# Patient Record
Sex: Female | Born: 1995 | Race: White | Hispanic: No | Marital: Single | Smoking: Never smoker
Health system: Southern US, Community
[De-identification: ages and names within clinical notes are randomized; demographics above are authoritative.]

## PROBLEM LIST (undated history)

## (undated) DIAGNOSIS — F419 Anxiety disorder, unspecified: Secondary | ICD-10-CM

## (undated) HISTORY — PX: WISDOM TOOTH EXTRACTION: SHX21

---
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
## 2016-06-01 ENCOUNTER — Ambulatory Visit: Payer: Self-pay | Admitting: Obstetrics & Gynecology

## 2016-06-01 ENCOUNTER — Telehealth: Payer: Self-pay | Admitting: Obstetrics & Gynecology

## 2016-06-01 NOTE — Telephone Encounter (Signed)
OK, see if Friday is OK (next time I am here).  OK to overbook after 3.

## 2016-06-01 NOTE — Telephone Encounter (Signed)
-----   Message from Nadara Mustard, MD sent at 06/01/2016  7:37 AM EDT ----- Regarding: appt this pm Please work in patient today after 3; let me know time

## 2016-06-01 NOTE — Telephone Encounter (Signed)
Called and spoke with Pt about working in today at 3:50. Per pt stated she wouldn't be able to come in.

## 2016-06-02 NOTE — Telephone Encounter (Signed)
Pt is schedule 06/05/16 with [facility] at 4:30

## 2016-06-05 ENCOUNTER — Encounter: Payer: Self-pay | Admitting: Obstetrics & Gynecology

## 2016-06-05 ENCOUNTER — Ambulatory Visit (INDEPENDENT_AMBULATORY_CARE_PROVIDER_SITE_OTHER): Payer: Commercial Managed Care - PPO | Admitting: Obstetrics & Gynecology

## 2016-06-05 VITALS — BP 116/70 | Ht 63.0 in | Wt 152.0 lb

## 2016-06-05 DIAGNOSIS — N930 Postcoital and contact bleeding: Secondary | ICD-10-CM | POA: Diagnosis not present

## 2016-06-05 NOTE — Progress Notes (Signed)
  HPI:      [name] is a 21 y.o. G0P0000 who LMP was Patient's last menstrual period was 05/08/2016., presents today for a problem visit.  She complains of: post coital bleeding.Vulvar symptoms include none.STI Risk: prior hx of treated chlamydia.  Other associated symptoms: none.Menstrual pattern: She had been bleeding regularly. Contraception: NuvaRing vaginal inserts  PMHx: She  has no past medical history on file. Also,  has a past surgical history that includes Wisdom tooth extraction., family history is not on file.,    She has a current medication list which includes the following prescription(s): etonogestrel-ethinyl estradiol and paroxetine. Also, has No Known Allergies.  Review of Systems  Constitutional: Negative for chills, fever and malaise/fatigue.  HENT: Negative for congestion, sinus pain and sore throat.   Eyes: Negative for blurred vision and pain.  Respiratory: Negative for cough and wheezing.   Cardiovascular: Negative for chest pain and leg swelling.  Gastrointestinal: Negative for abdominal pain, constipation, diarrhea, heartburn, nausea and vomiting.  Genitourinary: Negative for dysuria, frequency, hematuria and urgency.  Musculoskeletal: Negative for back pain, joint pain, myalgias and neck pain.  Skin: Negative for itching and rash.  Neurological: Negative for dizziness, tremors and weakness.  Endo/Heme/Allergies: Does not bruise/bleed easily.  Psychiatric/Behavioral: Negative for depression. The patient is not nervous/anxious and does not have insomnia.     Objective: BP 116/70   Ht 5\' 3"  (1.6 m)   Wt 152 lb (68.9 kg)   LMP 05/08/2016   BMI 26.93 kg/m  Physical Exam  Constitutional: She is oriented to person, place, and time. She appears well-developed and well-nourished. No distress.  Genitourinary: Vagina normal and uterus normal. Pelvic exam was performed with patient supine. There is no rash, tenderness or lesion on the right labia. There  is no rash, tenderness or lesion on the left labia. No erythema or bleeding in the vagina. Right adnexum does not display mass and does not display tenderness. Left adnexum does not display mass and does not display tenderness. Cervix does not exhibit motion tenderness, discharge, polyp or nabothian cyst.   Uterus is mobile and midaxial. Uterus is not enlarged or exhibiting a mass.  Abdominal: Soft. She exhibits no distension. There is no tenderness.  Musculoskeletal: Normal range of motion.  Neurological: She is alert and oriented to person, place, and time. No cranial nerve deficit.  Skin: Skin is warm and dry.  Psychiatric: She has a normal mood and affect.    ASSESSMENT/PLAN:    Problem List Items Addressed This Visit    None    Visit Diagnoses    PCB (post coital bleeding)    -  Primary   Relevant Orders   GC/Chlamydia Probe Amp      Annamarie MajorPaul Angeleen Horney, MD, Merlinda FrederickFACOG [facility], [facility] 06/05/2016  5:12 PM

## 2016-06-07 ENCOUNTER — Encounter: Payer: Self-pay | Admitting: Obstetrics & Gynecology

## 2016-06-10 LAB — GC/CHLAMYDIA PROBE AMP
Chlamydia trachomatis, NAA: NEGATIVE
Neisseria gonorrhoeae by PCR: NEGATIVE

## 2016-08-26 ENCOUNTER — Ambulatory Visit: Payer: Self-pay | Admitting: Obstetrics & Gynecology

## 2016-08-27 ENCOUNTER — Encounter: Payer: Self-pay | Admitting: Obstetrics & Gynecology

## 2016-08-27 ENCOUNTER — Ambulatory Visit (INDEPENDENT_AMBULATORY_CARE_PROVIDER_SITE_OTHER): Payer: Commercial Managed Care - PPO | Admitting: Obstetrics & Gynecology

## 2016-08-27 ENCOUNTER — Telehealth: Payer: Self-pay | Admitting: Obstetrics & Gynecology

## 2016-08-27 VITALS — BP 124/76 | Ht 63.0 in | Wt 143.0 lb

## 2016-08-27 DIAGNOSIS — Z202 Contact with and (suspected) exposure to infections with a predominantly sexual mode of transmission: Secondary | ICD-10-CM

## 2016-08-27 NOTE — Telephone Encounter (Signed)
Pt is schedule for today [facility]27/16 [facility]

## 2016-08-27 NOTE — Progress Notes (Signed)
  Gynecology STD Evaluation   Chief Complaint:  Chief Complaint  Patient presents with  . Follow-up    History of Present Illness: Patient is a 21 y.o. G0P0000 presents for STD testing.  The patient has not noted intermenstrual spotting,  has not experienced postcoital bleeding, and does not report increased vaginal discharge.  There is a history of prior sexually transmitted infection(s).     The patient is sexually active and reports multiple lifetime sexual partners . She currently uses Oral contraceptives: Present: yes for contraception. sometimes, the patient also relies on condoms to prevent the spread of sexually transmitted infections.  She patient has not been previously transfused or tattooed.    PMHx: She  has no past medical history on file. Also,  has a past surgical history that includes Wisdom tooth extraction., family history is not on file.,  has an unknown smoking status. She has never used smokeless tobacco.  She has a current medication list which includes the following prescription(s): etonogestrel-ethinyl estradiol and paroxetine. Also, has No Known Allergies.  Review of Systems  All other systems reviewed and are negative.   Objective: BP 124/76   Ht 5\' 3"  (1.6 m)   Wt 143 lb (64.9 kg)   LMP 07/28/2016   BMI 25.33 kg/m  Physical Exam  Constitutional: She is oriented to person, place, and time. She appears well-developed and well-nourished. No distress.  Genitourinary: Vagina normal and uterus normal. Pelvic exam was performed with patient supine. There is no rash, tenderness or lesion on the right labia. There is no rash, tenderness or lesion on the left labia. No erythema or bleeding in the vagina. Right adnexum does not display mass and does not display tenderness. Left adnexum does not display mass and does not display tenderness. Cervix does not exhibit motion tenderness, discharge, polyp or nabothian cyst.   Uterus is mobile and midaxial. Uterus is not  enlarged or exhibiting a mass.  Abdominal: Soft. She exhibits no distension. There is no tenderness.  Musculoskeletal: Normal range of motion.  Neurological: She is alert and oriented to person, place, and time. No cranial nerve deficit.  Skin: Skin is warm and dry.  Psychiatric: She has a normal mood and affect.    Female chaperone present for pelvic and breast  portions of the physical exam  Assessment: 21 y.o. G0P0000 No problem-specific Assessment & Plan notes found for this encounter.   Plan: Problem List Items Addressed This Visit      Other   STD exposure - Primary   Relevant Orders   Chlamydia/Gonococcus/Trichomonas, NAA       1) Contraception - Education given regarding options for contraception, including barrier methods.   2) STI screening was offered done (declines blood testing, done in past)  3) STD prevention  Annamarie MajorPaul Offie Pickron, MD, Merlinda FrederickFACOG [facility], [facility] 08/27/2016  10:33 AM

## 2016-08-27 NOTE — Telephone Encounter (Signed)
-----   Message from Nadara Mustardobert P Harris, MD sent at 08/27/2016  7:42 AM EDT ----- Regarding: appt Plz work in 0940 today and let her know

## 2016-08-29 LAB — CHLAMYDIA/GONOCOCCUS/TRICHOMONAS, NAA
CHLAMYDIA BY NAA: NEGATIVE
Gonococcus by NAA: NEGATIVE
Trich vag by NAA: NEGATIVE

## 2017-05-25 ENCOUNTER — Ambulatory Visit (INDEPENDENT_AMBULATORY_CARE_PROVIDER_SITE_OTHER): Payer: Commercial Managed Care - PPO | Admitting: Obstetrics and Gynecology

## 2017-05-25 ENCOUNTER — Encounter: Payer: Self-pay | Admitting: Obstetrics and Gynecology

## 2017-05-25 VITALS — BP 106/54 | HR 93 | Ht 64.0 in | Wt 146.0 lb

## 2017-05-25 DIAGNOSIS — Z113 Encounter for screening for infections with a predominantly sexual mode of transmission: Secondary | ICD-10-CM | POA: Diagnosis not present

## 2017-05-25 DIAGNOSIS — N760 Acute vaginitis: Secondary | ICD-10-CM | POA: Diagnosis not present

## 2017-05-25 NOTE — Progress Notes (Signed)
Patient ID: Dana Ruiz, female   DOB: 1995/02/19, 22 y.o.   MRN: [id number]  Reason for Consult: vaginal irritation (STD check)   Referred by No ref. provider found  Subjective:     HPI:  Dana Albertabigail Leigh Fabel is a 22 y.o. female she presents today with complaints of vaginal irritation and pain that was experienced with recent sexual activity.   History reviewed. No pertinent past medical history. History reviewed. No pertinent family history. Past Surgical History:  Procedure Laterality Date  . WISDOM TOOTH EXTRACTION      Short Social History:  Social History   Tobacco Use  . Smoking status: Never Smoker  . Smokeless tobacco: Never Used  Substance Use Topics  . Alcohol use: Yes    Comment: Occa    No Known Allergies  Current Outpatient Medications  Medication Sig Dispense Refill  . etonogestrel-ethinyl estradiol (NUVARING) 0.12-0.015 MG/24HR vaginal ring Place 1 each vaginally every 28 (twenty-eight) days. Insert vaginally and leave in place for 3 consecutive weeks, then remove for 1 week.    Marland Kitchen. PARoxetine (PAXIL) 10 MG tablet Take 10 mg by mouth daily.     No current facility-administered medications for this visit.     Review of Systems  Constitutional: Negative for chills, fatigue, fever and unexpected weight change.  HENT: Negative for trouble swallowing.  Eyes: Negative for loss of vision.  Respiratory: Negative for cough, shortness of breath and wheezing.  Cardiovascular: Negative for chest pain, leg swelling, palpitations and syncope.  GI: Negative for abdominal pain, blood in stool, diarrhea, nausea and vomiting.  GU: Negative for difficulty urinating, dysuria, frequency and hematuria.  Musculoskeletal: Negative for back pain, leg pain and joint pain.  Skin: Negative for rash.  Neurological: Negative for dizziness, headaches, light-headedness, numbness and seizures.  Psychiatric: Negative for behavioral problem, confusion, depressed mood and sleep  disturbance.        Objective:  Objective   Vitals:   05/25/17 1450  BP: (!) 106/54  Pulse: 93  Weight: 146 lb (66.2 kg)  Height: 5\' 4"  (1.626 m)   Body mass index is 25.06 kg/m.  Physical Exam  Constitutional: She is oriented to person, place, and time. She appears well-developed and well-nourished.  HENT:  Head: Normocephalic and atraumatic.  Eyes: EOM are normal.  Cardiovascular: Normal rate, regular rhythm and normal heart sounds.  Pulmonary/Chest: Effort normal and breath sounds normal.  Genitourinary: Vagina normal and uterus normal. There is no rash, tenderness or lesion on the right labia. There is no rash, tenderness or lesion on the left labia. Uterus is not deviated, not enlarged, not fixed and not tender. Cervix exhibits friability. Cervix exhibits no motion tenderness and no discharge. Right adnexum displays no mass, no tenderness and no fullness. Left adnexum displays no mass, no tenderness and no fullness. No erythema or bleeding in the vagina. No foreign body in the vagina. No vaginal discharge found.  Genitourinary Comments: Scant vaginal discharge. Two flat/firm lesions on left thigh near genitals concerning for genital warts, but patient reports that these have been present since before she was sexually active.  Neurological: She is alert and oriented to person, place, and time.  Skin: Skin is warm and dry.  Psychiatric: She has a normal mood and affect. Her behavior is normal. Judgment and thought content normal.  Nursing note and vitals reviewed.  Microscopic wet-mount exam shows negative for pathogens, normal epithelial cells.      Assessment/Plan:     22 year old  with vaginitis, no obvious infection at this time. Nuswab sent for STD testing, will treat based on results.   Adelene Idler MD [facility], [facility] 05/25/17 3:18 PM

## 2017-05-28 LAB — NUSWAB VAGINITIS PLUS (VG+)
CHLAMYDIA TRACHOMATIS, NAA: NEGATIVE
Candida albicans, NAA: NEGATIVE
Candida glabrata, NAA: NEGATIVE
NEISSERIA GONORRHOEAE, NAA: NEGATIVE
TRICH VAG BY NAA: NEGATIVE

## 2017-05-30 NOTE — Progress Notes (Signed)
Negative, released to mychart

## 2017-06-17 ENCOUNTER — Encounter: Payer: Self-pay | Admitting: Obstetrics & Gynecology

## 2017-06-17 ENCOUNTER — Ambulatory Visit (INDEPENDENT_AMBULATORY_CARE_PROVIDER_SITE_OTHER): Payer: 59 | Admitting: Obstetrics & Gynecology

## 2017-06-17 VITALS — BP 110/60 | Ht 63.0 in | Wt 148.0 lb

## 2017-06-17 DIAGNOSIS — R300 Dysuria: Secondary | ICD-10-CM | POA: Diagnosis not present

## 2017-06-17 DIAGNOSIS — N3000 Acute cystitis without hematuria: Secondary | ICD-10-CM

## 2017-06-17 LAB — POCT URINALYSIS DIPSTICK
Bilirubin, UA: NEGATIVE
Blood, UA: NEGATIVE
Glucose, UA: NEGATIVE
Ketones, UA: NEGATIVE
Nitrite, UA: NEGATIVE
Protein, UA: NEGATIVE
Spec Grav, UA: 1.015 (ref 1.010–1.025)
Urobilinogen, UA: 1 E.U./dL
pH, UA: 7 (ref 5.0–8.0)

## 2017-06-17 MED ORDER — FLAVOXATE HCL 100 MG PO TABS: 100.0000 mg | ORAL_TABLET | Freq: Three times a day (TID) | ORAL | 1 refills | Status: DC | PRN

## 2017-06-17 MED ORDER — SULFAMETHOXAZOLE-TRIMETHOPRIM 800-160 MG PO TABS: 1.0000 | ORAL_TABLET | Freq: Two times a day (BID) | ORAL | 0 refills | Status: DC

## 2017-06-17 NOTE — Progress Notes (Signed)
HPI:      [name] is a 22 y.o. G0P0000 who LMP was Patient's last menstrual period was 05/25/2017., presents today for a problem visit.    Urinary Tract Infection: Patient complains of burning with urination . She has had symptoms for 1 day. Patient also complains of concern over condome breaking last night and concern for retained latex/condom vaginally.  Pt uses ring for [facility] and it is in place.. Patient denies back pain, fever and vaginal discharge. Patient does not have a history of recurrent UTI.  Patient does not have a history of pyelonephritis.   PMHx: She  has no past medical history on file. Also,  has a past surgical history that includes Wisdom tooth extraction., family history is not on file.,  reports that she has never smoked. She has never used smokeless tobacco. She reports that she drinks alcohol. She reports that she does not use drugs.  She has a current medication list which includes the following prescription(s): etonogestrel-ethinyl estradiol, flavoxate, paroxetine, and sulfamethoxazole-trimethoprim. Also, has No Known Allergies.  Review of Systems  Constitutional: Negative for chills, fever and malaise/fatigue.  HENT: Negative for congestion, sinus pain and sore throat.   Eyes: Negative for blurred vision and pain.  Respiratory: Negative for cough and wheezing.   Cardiovascular: Negative for chest pain and leg swelling.  Gastrointestinal: Negative for abdominal pain, constipation, diarrhea, heartburn, nausea and vomiting.  Genitourinary: Negative for dysuria, frequency, hematuria and urgency.  Musculoskeletal: Negative for back pain, joint pain, myalgias and neck pain.  Skin: Negative for itching and rash.  Neurological: Negative for dizziness, tremors and weakness.  Endo/Heme/Allergies: Does not bruise/bleed easily.  Psychiatric/Behavioral: Negative for depression. The patient is not nervous/anxious and does not have insomnia.     Objective: BP 110/60    Ht  (1.6 m)   Wt 148 lb (67.1 kg)   LMP 05/25/2017   BMI 26.22 kg/m  Physical Exam  Constitutional: She is oriented to person, place, and time. She appears well-developed and well-nourished. No distress.  Genitourinary: Vagina normal and uterus normal. Pelvic exam was performed with patient supine. There is no rash, tenderness or lesion on the right labia. There is no rash, tenderness or lesion on the left labia. No erythema or bleeding in the vagina. Right adnexum does not display mass and does not display tenderness. Left adnexum does not display mass and does not display tenderness. Cervix does not exhibit motion tenderness, discharge, polyp or nabothian cyst.   Uterus is mobile and midaxial. Uterus is not enlarged or exhibiting a mass.  Genitourinary Comments: No foreign body in vagina  HENT:  Head: Normocephalic and atraumatic.  Nose: Nose normal.  Mouth/Throat: Oropharynx is clear and moist.  Abdominal: Soft. She exhibits no distension. There is no tenderness.  Musculoskeletal: Normal range of motion.  Neurological: She is alert and oriented to person, place, and time. No cranial nerve deficit.  Skin: Skin is warm and dry.  Psychiatric: She has a normal mood and affect.   Results for orders placed or performed in visit on 06/17/17  POCT urinalysis dipstick  Result Value Ref Range   Color, UA     Clarity, UA     Glucose, UA neg    Bilirubin, UA neg    Ketones, UA neg    Spec Grav, UA 1.015 1.010 - 1.025   Blood, UA neg    pH, UA 7.0 5.0 - 8.0   Protein, UA neg    Urobilinogen,  UA 1.0 0.2 or 1.0 E.U./dL   Nitrite, UA neg    Leukocytes, UA Moderate (2+) (A) Negative   Appearance     Odor     ASSESSMENT/PLAN:   Acute cystitis  Dysuria    -  Primary   Relevant Orders   POCT urinalysis dipstick (Completed)    ABX for UTI Urispas for dysuria  Annamarie Major, MD, [facility], [facility] 06/17/2017  11:19 AM

## 2017-06-18 ENCOUNTER — Ambulatory Visit: Payer: Commercial Managed Care - PPO | Admitting: Obstetrics & Gynecology

## 2017-06-18 ENCOUNTER — Other Ambulatory Visit: Payer: Self-pay | Admitting: Obstetrics & Gynecology

## 2017-06-18 MED ORDER — CIPROFLOXACIN HCL 500 MG PO TABS: 500.0000 mg | ORAL_TABLET | Freq: Two times a day (BID) | ORAL | 0 refills | Status: AC

## 2017-07-20 ENCOUNTER — Other Ambulatory Visit: Payer: Self-pay | Admitting: Obstetrics & Gynecology

## 2017-07-20 ENCOUNTER — Encounter: Payer: Self-pay | Admitting: Obstetrics & Gynecology

## 2017-07-20 MED ORDER — ETONOGESTREL-ETHINYL ESTRADIOL 0.12-0.015 MG/24HR VA RING: 1.0000 | VAGINAL_RING | VAGINAL | 3 refills | Status: DC

## 2017-08-06 ENCOUNTER — Encounter: Payer: Self-pay | Admitting: Obstetrics & Gynecology

## 2017-08-23 ENCOUNTER — Encounter: Payer: Self-pay | Admitting: Obstetrics & Gynecology

## 2017-08-23 ENCOUNTER — Ambulatory Visit (INDEPENDENT_AMBULATORY_CARE_PROVIDER_SITE_OTHER): Payer: 59 | Admitting: Obstetrics & Gynecology

## 2017-08-23 ENCOUNTER — Other Ambulatory Visit (HOSPITAL_COMMUNITY)
Admission: RE | Admit: 2017-08-23 | Discharge: 2017-08-23 | Disposition: A | Discharge status: Home / Self Care | Payer: 59 | Source: Ambulatory Visit | Attending: Obstetrics & Gynecology | Admitting: Obstetrics & Gynecology

## 2017-08-23 VITALS — BP 110/80 | Ht 63.0 in | Wt 149.0 lb

## 2017-08-23 DIAGNOSIS — Z124 Encounter for screening for malignant neoplasm of cervix: Secondary | ICD-10-CM

## 2017-08-23 DIAGNOSIS — Z01419 Encounter for gynecological examination (general) (routine) without abnormal findings: Secondary | ICD-10-CM | POA: Diagnosis not present

## 2017-08-23 DIAGNOSIS — Z0000 Encounter for general adult medical examination without abnormal findings: Secondary | ICD-10-CM

## 2017-08-23 MED ORDER — ETONOGESTREL-ETHINYL ESTRADIOL 0.12-0.015 MG/24HR VA RING: 1.0000 | VAGINAL_RING | VAGINAL | 3 refills | Status: DC

## 2017-08-23 NOTE — Progress Notes (Signed)
HPI:      [name] is a 22 y.o. G0P0000 who LMP was Patient's last menstrual period was 08/19/2017., she presents today for her annual examination. The patient has no complaints today other than pain vaginally w sex at times, burning pain, no deep pain.  No recent discharge. The patient is sexually active. Her last pap: was normal. The patient does perform self breast exams.  There is no notable family history of breast or ovarian cancer in her family.  The patient has regular exercise: yes.  The patient denies current symptoms of depression.    GYN History: Contraception: NuvaRing vaginal inserts  PMHx: History reviewed. No pertinent past medical history. Past Surgical History:  Procedure Laterality Date  . WISDOM TOOTH EXTRACTION     History reviewed. No pertinent family history. Social History   Tobacco Use  . Smoking status: Never Smoker  . Smokeless tobacco: Never Used  Substance Use Topics  . Alcohol use: Yes    Comment: Occa  . Drug use: Never    Current Outpatient Medications:  .  etonogestrel-ethinyl estradiol (NUVARING) 0.12-0.015 MG/24HR vaginal ring, Place 1 each vaginally every 28 (twenty-eight) days. Insert vaginally and leave in place for 3 consecutive weeks, then remove for 1 week., Disp: 3 each, Rfl: 3 .  flavoxATE (URISPAS) 100 MG tablet, Take 1 tablet (100 mg total) by mouth 3 (three) times daily as needed for bladder spasms (bladder spasm)., Disp: 15 tablet, Rfl: 1 .  PARoxetine (PAXIL) 10 MG tablet, Take 10 mg by mouth daily., Disp: , Rfl:  Allergies: Patient has no known allergies.  Review of Systems  Constitutional: Negative for chills, fever and malaise/fatigue.  HENT: Negative for congestion, sinus pain and sore throat.   Eyes: Negative for blurred vision and pain.  Respiratory: Negative for cough and wheezing.   Cardiovascular: Negative for chest pain and leg swelling.  Gastrointestinal: Negative for abdominal pain, constipation,  diarrhea, heartburn, nausea and vomiting.  Genitourinary: Negative for dysuria, frequency, hematuria and urgency.  Musculoskeletal: Negative for back pain, joint pain, myalgias and neck pain.  Skin: Negative for itching and rash.  Neurological: Negative for dizziness, tremors and weakness.  Endo/Heme/Allergies: Does not bruise/bleed easily.  Psychiatric/Behavioral: Negative for depression. The patient is not nervous/anxious and does not have insomnia.     Objective: BP 110/80   Ht 5\' 3"  (1.6 m)   Wt 149 lb (67.6 kg)   LMP 08/19/2017   BMI 26.39 kg/m   Filed Weights   08/23/17 1007  Weight: 149 lb (67.6 kg)   Body mass index is 26.39 kg/m. Physical Exam  Constitutional: She is oriented to person, place, and time. She appears well-developed and well-nourished. No distress.  Genitourinary: Rectum normal, vagina normal and uterus normal. Pelvic exam was performed with patient supine. There is no rash or lesion on the right labia. There is no rash or lesion on the left labia. Vagina exhibits no lesion. No bleeding in the vagina. Right adnexum does not display mass and does not display tenderness. Left adnexum does not display mass and does not display tenderness. Cervix does not exhibit motion tenderness, lesion, friability or polyp.   Uterus is mobile and midaxial. Uterus is not enlarged or exhibiting a mass.  HENT:  Head: Normocephalic and atraumatic. Head is without laceration.  Right Ear: Hearing normal.  Left Ear: Hearing normal.  Nose: No epistaxis.  No foreign bodies.  Mouth/Throat: Uvula is midline, oropharynx is clear and moist and mucous membranes  are normal.  Eyes: Pupils are equal, round, and reactive to light.  Neck: Normal range of motion. Neck supple. No thyromegaly present.  Cardiovascular: Normal rate and regular rhythm. Exam reveals no gallop and no friction rub.  No murmur heard. Pulmonary/Chest: Effort normal and breath sounds normal. No respiratory distress. She  has no wheezes. Right breast exhibits no mass, no skin change and no tenderness. Left breast exhibits no mass, no skin change and no tenderness.  Abdominal: Soft. Bowel sounds are normal. She exhibits no distension. There is no tenderness. There is no rebound.  Musculoskeletal: Normal range of motion.  Neurological: She is alert and oriented to person, place, and time. No cranial nerve deficit.  Skin: Skin is warm and dry.  Psychiatric: She has a normal mood and affect. Judgment normal.  Vitals reviewed.  Assessment:  ANNUAL EXAM 1. Screening for cervical cancer   2. Annual physical exam    Screening Plan:            1.  Cervical Screening-  Pap smear done today  2. Labs managed by PCP . None needed/appropriate today.  3. Counseling for contraception: NuvaRing vaginal inserts     F/U  Return in about 1 year (around 08/24/2018) for Annual.  Annamarie Major, MD, [facility], [facility] 08/23/2017  10:27 AM

## 2017-08-23 NOTE — Patient Instructions (Signed)
Ethinyl Estradiol; Etonogestrel vaginal ring What is this medicine? ETHINYL ESTRADIOL; ETONOGESTREL (ETH in il es tra DYE ole; et oh noe JES trel) vaginal ring is a flexible, vaginal ring used as a contraceptive (birth control method). This medicine combines two types of female hormones, an estrogen and a progestin. This ring is used to prevent ovulation and pregnancy. Each ring is effective for one month. This medicine may be used for other purposes; ask your health care provider or pharmacist if you have questions. COMMON BRAND NAME(S): NuvaRing What should I tell my health care provider before I take this medicine? They need to know if you have or ever had any of these conditions: -abnormal vaginal bleeding -blood vessel disease or blood clots -breast, cervical, endometrial, ovarian, liver, or uterine cancer -diabetes -gallbladder disease -heart disease or recent heart attack -high blood pressure -high cholesterol -kidney disease -liver disease -migraine headaches -stroke -systemic lupus erythematosus (SLE) -tobacco smoker -an unusual or allergic reaction to estrogens, progestins, other medicines, foods, dyes, or preservatives -pregnant or trying to get pregnant -breast-feeding How should I use this medicine? Insert the ring into your vagina as directed. Follow the directions on the prescription label. The ring will remain place for 3 weeks and is then removed for a 1-week break. A new ring is inserted 1 week after the last ring was removed, on the same day of the week. Check often to make sure the ring is still in place, especially before and after sexual intercourse. If the ring was out of the vagina for an unknown amount of time, you may not be protected from pregnancy. Perform a pregnancy test and call your doctor. Do not use more often than directed. A patient package insert for the product will be given with each prescription and refill. Read this sheet carefully each time. The  sheet may change frequently. Contact your pediatrician regarding the use of this medicine in children. Special care may be needed. This medicine has been used in female children who have started having menstrual periods. Overdosage: If you think you have taken too much of this medicine contact a poison control center or emergency room at once. NOTE: This medicine is only for you. Do not share this medicine with others. What if I miss a dose? You will need to replace your vaginal ring once a month as directed. If the ring should slip out, or if you leave it in longer or shorter than you should, contact your health care professional for advice. What may interact with this medicine? Do not take this medicine with the following medication: -dasabuvir; ombitasvir; paritaprevir; ritonavir -ombitasvir; paritaprevir; ritonavir This medicine may also interact with the following medications: -acetaminophen -antibiotics or medicines for infections, especially rifampin, rifabutin, rifapentine, and griseofulvin, and possibly penicillins or tetracyclines -aprepitant -ascorbic acid (vitamin C) -atorvastatin -barbiturate medicines, such as phenobarbital -bosentan -carbamazepine -caffeine -clofibrate -cyclosporine -dantrolene -doxercalciferol -felbamate -grapefruit juice -hydrocortisone -medicines for anxiety or sleeping problems, such as diazepam or temazepam -medicines for diabetes, including pioglitazone -modafinil -mycophenolate -nefazodone -oxcarbazepine -phenytoin -prednisolone -ritonavir or other medicines for HIV infection or AIDS -rosuvastatin -selegiline -soy isoflavones supplements -St. John's wort -tamoxifen or raloxifene -theophylline -thyroid hormones -topiramate -warfarin This list may not describe all possible interactions. Give your health care provider a list of all the medicines, herbs, non-prescription drugs, or dietary supplements you use. Also tell them if you smoke,  drink alcohol, or use illegal drugs. Some items may interact with your medicine. What should I watch for while using   this medicine? Visit your doctor or health care professional for regular checks on your progress. You will need a regular breast and pelvic exam and Pap smear while on this medicine. Use an additional method of contraception during the first cycle that you use this ring. Do not use a diaphragm or female condom, as the ring can interfere with these birth control methods and their proper placement. If you have any reason to think you are pregnant, stop using this medicine right away and contact your doctor or health care professional. If you are using this medicine for hormone related problems, it may take several cycles of use to see improvement in your condition. Smoking increases the risk of getting a blood clot or having a stroke while you are using hormonal birth control, especially if you are more than 24 years old. You are strongly advised not to smoke. This medicine can make your body retain fluid, making your fingers, hands, or ankles swell. Your blood pressure can go up. Contact your doctor or health care professional if you feel you are retaining fluid. This medicine can make you more sensitive to the sun. Keep out of the sun. If you cannot avoid being in the sun, wear protective clothing and use sunscreen. Do not use sun lamps or tanning beds/booths. If you wear contact lenses and notice visual changes, or if the lenses begin to feel uncomfortable, consult your eye care specialist. In some women, tenderness, swelling, or minor bleeding of the gums may occur. Notify your dentist if this happens. Brushing and flossing your teeth regularly may help limit this. See your dentist regularly and inform your dentist of the medicines you are taking. If you are going to have elective surgery, you may need to stop using this medicine before the surgery. Consult your health care professional  for advice. This medicine does not protect you against HIV infection (AIDS) or any other sexually transmitted diseases. What side effects may I notice from receiving this medicine? Side effects that you should report to your doctor or health care professional as soon as possible: -breast tissue changes or discharge -changes in vaginal bleeding during your period or between your periods -chest pain -coughing up blood -dizziness or fainting spells -headaches or migraines -leg, arm or groin pain -severe or sudden headaches -stomach pain (severe) -sudden shortness of breath -sudden loss of coordination, especially on one side of the body -speech problems -symptoms of vaginal infection like itching, irritation or unusual discharge -tenderness in the upper abdomen -vomiting -weakness or numbness in the arms or legs, especially on one side of the body -yellowing of the eyes or skin Side effects that usually do not require medical attention (report to your doctor or health care professional if they continue or are bothersome): -breakthrough bleeding and spotting that continues beyond the 3 initial cycles of pills -breast tenderness -mood changes, anxiety, depression, frustration, anger, or emotional outbursts -increased sensitivity to sun or ultraviolet light -nausea -skin rash, acne, or brown spots on the skin -weight gain (slight) This list may not describe all possible side effects. Call your doctor for medical advice about side effects. You may report side effects to FDA at 1-800-FDA-1088. Where should I keep my medicine? Keep out of the reach of children. Store at room temperature between 15 and 30 degrees C (59 and 86 degrees F) for up to 4 months. The product will expire after 4 months. Protect from light. Throw away any unused medicine after the expiration date. NOTE: This   sheet is a summary. It may not cover all possible information. If you have questions about this medicine, talk  to your doctor, pharmacist, or health care provider.  2018 Elsevier/Gold Standard (2015-09-27 17:00:31)  

## 2017-08-24 LAB — CYTOLOGY - PAP
CHLAMYDIA, DNA PROBE: NEGATIVE
Diagnosis: NEGATIVE
Neisseria Gonorrhea: NEGATIVE
Trichomonas: NEGATIVE

## 2018-05-14 ENCOUNTER — Other Ambulatory Visit: Payer: Self-pay | Admitting: Obstetrics & Gynecology

## 2018-05-14 MED ORDER — HYDROCORTISONE ACE-PRAMOXINE 1-1 % RE FOAM: 1.0000 | Freq: Two times a day (BID) | RECTAL | 1 refills | Status: DC

## 2018-07-20 ENCOUNTER — Other Ambulatory Visit: Payer: Self-pay | Admitting: Obstetrics & Gynecology

## 2018-09-05 ENCOUNTER — Other Ambulatory Visit: Payer: Self-pay

## 2018-09-05 ENCOUNTER — Ambulatory Visit (INDEPENDENT_AMBULATORY_CARE_PROVIDER_SITE_OTHER): Payer: Commercial Managed Care - PPO | Admitting: Obstetrics & Gynecology

## 2018-09-05 ENCOUNTER — Encounter: Payer: Self-pay | Admitting: Obstetrics & Gynecology

## 2018-09-05 ENCOUNTER — Other Ambulatory Visit (HOSPITAL_COMMUNITY)
Admission: RE | Admit: 2018-09-05 | Discharge: 2018-09-05 | Disposition: A | Discharge status: Home / Self Care | Payer: Commercial Managed Care - PPO | Source: Ambulatory Visit | Attending: Obstetrics & Gynecology | Admitting: Obstetrics & Gynecology

## 2018-09-05 DIAGNOSIS — Z3044 Encounter for surveillance of vaginal ring hormonal contraceptive device: Secondary | ICD-10-CM

## 2018-09-05 DIAGNOSIS — Z124 Encounter for screening for malignant neoplasm of cervix: Secondary | ICD-10-CM | POA: Diagnosis present

## 2018-09-05 DIAGNOSIS — Z01419 Encounter for gynecological examination (general) (routine) without abnormal findings: Secondary | ICD-10-CM

## 2018-09-05 DIAGNOSIS — F419 Anxiety disorder, unspecified: Secondary | ICD-10-CM

## 2018-09-05 MED ORDER — ETONOGESTREL-ETHINYL ESTRADIOL 0.12-0.015 MG/24HR VA RING: VAGINAL_RING | VAGINAL | 3 refills | Status: DC

## 2018-09-05 MED ORDER — PAROXETINE HCL 10 MG PO TABS: 10.0000 mg | ORAL_TABLET | Freq: Every day | ORAL | 3 refills | Status: DC

## 2018-09-05 NOTE — Progress Notes (Signed)
HPI:      [name] is a 23 y.o. G0P0000 who LMP was No LMP recorded., she presents today for her annual examination. The patient has no complaints today. The patient is sexually active. Her last pap: was normal. The patient does perform self breast exams.  There is no notable family history of breast or ovarian cancer in her family.  The patient has regular exercise: yes.  The patient denies current symptoms of depression.    GYN History: Contraception: NuvaRing vaginal inserts  PMHx: History reviewed. No pertinent past medical history. Past Surgical History:  Procedure Laterality Date  . WISDOM TOOTH EXTRACTION     History reviewed. No pertinent family history. Social History   Tobacco Use  . Smoking status: Never Smoker  . Smokeless tobacco: Never Used  Substance Use Topics  . Alcohol use: Yes    Comment: Occa  . Drug use: Never    Current Outpatient Medications:  .  etonogestrel-ethinyl estradiol (NUVARING) 0.12-0.015 MG/24HR vaginal ring, INSERT 1 RING VAGINALLY AND LEAVE IN PLACE FOR 3  CONSECUTIVE WEEKS, THEN  REMOVE FOR 1 WEEK, Disp: 3 each, Rfl: 3 .  flavoxATE (URISPAS) 100 MG tablet, Take 1 tablet (100 mg total) by mouth 3 (three) times daily as needed for bladder spasms (bladder spasm)., Disp: 15 tablet, Rfl: 1 .  hydrocortisone-pramoxine (PROCTOFOAM-HC) rectal foam, Place 1 applicator rectally 2 (two) times daily., Disp: 10 g, Rfl: 1 .  PARoxetine (PAXIL) 10 MG tablet, Take 1 tablet (10 mg total) by mouth daily., Disp: 90 tablet, Rfl: 3 Allergies: Patient has no known allergies.  Review of Systems  Constitutional: Negative for chills, fever and malaise/fatigue.  HENT: Negative for congestion, sinus pain and sore throat.   Eyes: Negative for blurred vision and pain.  Respiratory: Negative for cough and wheezing.   Cardiovascular: Negative for chest pain and leg swelling.  Gastrointestinal: Negative for abdominal pain, constipation, diarrhea, heartburn,  nausea and vomiting.  Genitourinary: Negative for dysuria, frequency, hematuria and urgency.  Musculoskeletal: Negative for back pain, joint pain, myalgias and neck pain.  Skin: Negative for itching and rash.  Neurological: Negative for dizziness, tremors and weakness.  Endo/Heme/Allergies: Does not bruise/bleed easily.  Psychiatric/Behavioral: Negative for depression. The patient is not nervous/anxious and does not have insomnia.     Objective:   P80  BP 110/70  R 20 Physical Exam Constitutional:      General: She is not in acute distress.    Appearance: She is well-developed.  Genitourinary:     Pelvic exam was performed with patient supine.     Vagina, uterus and rectum normal.     No lesions in the vagina.     No vaginal bleeding.     No cervical motion tenderness, friability, lesion or polyp.     Uterus is mobile.     Uterus is not enlarged.     No uterine mass detected.    Uterus is midaxial.     No right or left adnexal mass present.     Right adnexa not tender.     Left adnexa not tender.  HENT:     Head: Normocephalic and atraumatic. No laceration.     Right Ear: Hearing normal.     Left Ear: Hearing normal.     Mouth/Throat:     Pharynx: Uvula midline.  Eyes:     Pupils: Pupils are equal, round, and reactive to light.  Neck:     Musculoskeletal: Normal range  of motion and neck supple.     Thyroid: No thyromegaly.  Cardiovascular:     Rate and Rhythm: Normal rate and regular rhythm.     Heart sounds: No murmur. No friction rub. No gallop.   Pulmonary:     Effort: Pulmonary effort is normal. No respiratory distress.     Breath sounds: Normal breath sounds. No wheezing.  Chest:     Breasts:        Right: No mass, skin change or tenderness.        Left: No mass, skin change or tenderness.  Abdominal:     General: Bowel sounds are normal. There is no distension.     Palpations: Abdomen is soft.     Tenderness: There is no abdominal tenderness. There is no  rebound.  Musculoskeletal: Normal range of motion.  Neurological:     Mental Status: She is alert and oriented to person, place, and time.     Cranial Nerves: No cranial nerve deficit.  Skin:    General: Skin is warm and dry.  Psychiatric:        Judgment: Judgment normal.  Vitals signs reviewed.     Assessment:  ANNUAL EXAM 1. Women's annual routine gynecological examination   2. Screening for cervical cancer   3. Encounter for surveillance of vaginal ring hormonal contraceptive device   4. Anxiety      Screening Plan:            1.  Cervical Screening-  Pap smear done today  2. Labs next year  3. Counseling for contraception: NuvaRing vaginal inserts  Encounter for surveillance of vaginal ring hormonal contraceptive device - etonogestrel-ethinyl estradiol (NUVARING) 0.12-0.015 MG/24HR vaginal ring; INSERT 1 RING VAGINALLY AND LEAVE IN PLACE FOR 3  CONSECUTIVE WEEKS, THEN  REMOVE FOR 1 WEEK  Dispense: 3 each; Refill: 3   4. Anxiety - PARoxetine (PAXIL) 10 MG tablet; Take 1 tablet (10 mg total) by mouth daily.  Dispense: 90 tablet; Refill: 3 - She has taken this medicine for 4 years.  Stopped 2 mos ago but sx's have escalated particularly panic attacks.  Stress right now w Corona virus, also teaching.     F/U  Return in about 1 year (around 09/05/2019) for Annual.  Annamarie MajorPaul Zella Dewan, MD, Merlinda FrederickFACOG [facility], [facility] 09/05/2018  9:37 AM

## 2018-09-07 LAB — CYTOLOGY - PAP
Chlamydia: NEGATIVE
Diagnosis: NEGATIVE
Neisseria Gonorrhea: NEGATIVE
Trichomonas: NEGATIVE

## 2019-03-19 ENCOUNTER — Emergency Department (HOSPITAL_COMMUNITY): Payer: Commercial Managed Care - PPO

## 2019-03-19 ENCOUNTER — Other Ambulatory Visit: Payer: Self-pay

## 2019-03-19 ENCOUNTER — Encounter (HOSPITAL_COMMUNITY): Payer: Self-pay | Admitting: Emergency Medicine

## 2019-03-19 ENCOUNTER — Emergency Department (HOSPITAL_COMMUNITY)
Admission: EM | Admit: 2019-03-19 | Discharge: 2019-03-19 | Disposition: A | Discharge status: Home / Self Care | Payer: Commercial Managed Care - PPO | Attending: Emergency Medicine | Admitting: Emergency Medicine

## 2019-03-19 DIAGNOSIS — W1789XA Other fall from one level to another, initial encounter: Secondary | ICD-10-CM | POA: Diagnosis not present

## 2019-03-19 DIAGNOSIS — T1490XA Injury, unspecified, initial encounter: Secondary | ICD-10-CM

## 2019-03-19 DIAGNOSIS — Y929 Unspecified place or not applicable: Secondary | ICD-10-CM | POA: Insufficient documentation

## 2019-03-19 DIAGNOSIS — Y999 Unspecified external cause status: Secondary | ICD-10-CM | POA: Diagnosis not present

## 2019-03-19 DIAGNOSIS — S99912A Unspecified injury of left ankle, initial encounter: Secondary | ICD-10-CM | POA: Diagnosis present

## 2019-03-19 DIAGNOSIS — S93402A Sprain of unspecified ligament of left ankle, initial encounter: Secondary | ICD-10-CM | POA: Diagnosis not present

## 2019-03-19 DIAGNOSIS — Y93A2 Activity, calisthenics: Secondary | ICD-10-CM | POA: Diagnosis not present

## 2019-03-19 MED ORDER — IBUPROFEN 800 MG PO TABS: 800.0000 mg | ORAL_TABLET | Freq: Three times a day (TID) | ORAL | 0 refills | Status: DC | PRN

## 2019-03-19 NOTE — ED Notes (Signed)
This RN applied ace wrap to L ankle, crutches at bedside. Ortho paged to apply Cam Walker/crutches

## 2019-03-19 NOTE — Discharge Instructions (Signed)
Follow-up with the orthopedist provided.  Ice and elevate your ankle.  Return here for any changes or worsening in your condition as needed.

## 2019-03-19 NOTE — ED Triage Notes (Signed)
Doing jumps at gym and came down twisting L ankle.   Pt has edema and slight bruising to area, unable to WB.  Ice applied and extremity elevated.  Pt denies any other injuries.

## 2019-03-19 NOTE — ED Provider Notes (Signed)
[facility] EMERGENCY DEPARTMENT Provider Note   CSN: [id number] Arrival date & time: 03/19/19  1818     History Chief Complaint  Patient presents with  . Leg Injury    Dana Ruiz is a 24 y.o. female.  HPI Patient presents to the emergency department with a left ankle injury that occurred earlier this evening.  The patient states she was doing box jumps when she came down off the box and twisted her left ankle.  Patient states that she had swelling and pain to the ankle since that time.  Patient states that she did not take any medications prior to arrival for her symptoms.  The patient states that she has a neighbor that works for a [employer] who felt that she may have broken it or dislocated it.  Patient denies any numbness or weakness in the leg or foot.    History reviewed. No pertinent past medical history.  Patient Active Problem List   Diagnosis Date Noted  . STD exposure 08/27/2016    Past Surgical History:  Procedure Laterality Date  . WISDOM TOOTH EXTRACTION       OB History    Gravida  0   Para  0   Term  0   Preterm  0   AB  0   Living  0     SAB  0   TAB  0   Ectopic  0   Multiple  0   Live Births  0           No family history on file.  Social History   Tobacco Use  . Smoking status: Never Smoker  . Smokeless tobacco: Never Used  Substance Use Topics  . Alcohol use: Yes    Comment: Occa  . Drug use: Never    Home Medications Prior to Admission medications   Medication Sig Start Date End Date Taking? Authorizing Provider  etonogestrel-ethinyl estradiol (NUVARING) 0.12-0.015 MG/24HR vaginal ring INSERT 1 RING VAGINALLY AND LEAVE IN PLACE FOR 3  CONSECUTIVE WEEKS, THEN  REMOVE FOR 1 WEEK 09/05/18   Gae Dry, MD  flavoxATE (URISPAS) 100 MG tablet Take 1 tablet (100 mg total) by mouth 3 (three) times daily as needed for bladder spasms (bladder spasm). 06/17/17   Gae Dry, MD    hydrocortisone-pramoxine [facility]) rectal foam Place 1 applicator rectally 2 (two) times daily. 05/14/18   Gae Dry, MD  PARoxetine (PAXIL) 10 MG tablet Take 1 tablet (10 mg total) by mouth daily. 09/05/18   Gae Dry, MD    Allergies    Patient has no known allergies.  Review of Systems   Review of Systems All other systems negative except as documented in the HPI. All pertinent positives and negatives as reviewed in the HPI. Physical Exam Updated Vital Signs BP 128/77   Pulse 84   Temp (!) 97.5 F (36.4 C) (Oral)   Resp 14   Ht 5\' 4"  (1.626 m)   Wt 65.8 kg   LMP 02/22/2019   SpO2 97%   BMI 24.89 kg/m   Physical Exam Vitals and nursing note reviewed.  Constitutional:      General: She is not in acute distress.    Appearance: She is well-developed.  HENT:     Head: Normocephalic and atraumatic.  Eyes:     Pupils: Pupils are equal, round, and reactive to light.  Pulmonary:     Effort: Pulmonary effort is normal.  Musculoskeletal:       Feet:  Skin:    General: Skin is warm and dry.  Neurological:     Mental Status: She is alert and oriented to person, place, and time.     ED Results / Procedures / Treatments   Labs (all labs ordered are listed, but only abnormal results are displayed) Labs Reviewed - No data to display  EKG None  Radiology DG Ankle Complete Left  Result Date: 03/19/2019 CLINICAL DATA:  Pt states she was doing a vertical jump at the gym today and landed in an odd position on her left ankle. Pt unable to move ankle at all due to pain. Best obtainable images at this time due to pain. EXAM: LEFT ANKLE COMPLETE - 3+ VIEW COMPARISON:  None. FINDINGS: There is no evidence of fracture, dislocation, or joint effusion. There is no evidence of arthropathy or other focal bone abnormality. Soft tissues are unremarkable. IMPRESSION: Negative radiographs of the left ankle. Electronically Signed   By: Emmaline Kluver M.D.   On: 03/19/2019  18:46    Procedures Procedures (including critical care time)  Medications Ordered in ED Medications - No data to display  ED Course  I have reviewed the triage vital signs and the nursing notes.  Pertinent labs & imaging results that were available during my care of the patient were reviewed by me and considered in my medical decision making (see chart for details).    MDM Rules/Calculators/A&P                      Patient has significant swelling to the ankle and we will place an Ace wrap for compression and support along with a cam walker and crutches.  I did advise that she will need to see orthopedics as this most likely represents possible ligamentous injury.  I advised her to return here for any worsening in her condition.  But there is no fracture dislocations on the x-rays.  Told to ice and elevate the ankle. Final Clinical Impression(s) / ED Diagnoses Final diagnoses:  Injury    Rx / DC Orders ED Discharge Orders    None       Charlestine Night, Cordelia Poche 03/19/19 1936    Arby Barrette, MD 03/20/19 1701

## 2019-03-19 NOTE — ED Triage Notes (Signed)
Pt arrived EMS with complaints of left ankly injury. Pt reports she was crossfit training with box vertical jumps and as she came down she landed wrong and heard a pop with instant 10/10 pain.   Pt took 800mg  ibuprofen before coming to the ED.

## 2019-03-19 NOTE — ED Notes (Signed)
Ortho paged a second time for application of Cam Walker

## 2019-06-30 ENCOUNTER — Other Ambulatory Visit: Payer: Self-pay | Admitting: Obstetrics & Gynecology

## 2019-06-30 DIAGNOSIS — F419 Anxiety disorder, unspecified: Secondary | ICD-10-CM

## 2019-07-04 NOTE — Telephone Encounter (Signed)
Voicemail box is full un able to leave patient message to call back to be scheduled

## 2019-08-04 ENCOUNTER — Other Ambulatory Visit: Payer: Self-pay | Admitting: Obstetrics & Gynecology

## 2019-08-04 DIAGNOSIS — Z3044 Encounter for surveillance of vaginal ring hormonal contraceptive device: Secondary | ICD-10-CM

## 2019-09-06 ENCOUNTER — Other Ambulatory Visit (HOSPITAL_COMMUNITY)
Admission: RE | Admit: 2019-09-06 | Discharge: 2019-09-06 | Disposition: A | Discharge status: Home / Self Care | Payer: Commercial Managed Care - PPO | Source: Ambulatory Visit | Attending: Obstetrics & Gynecology | Admitting: Obstetrics & Gynecology

## 2019-09-06 ENCOUNTER — Other Ambulatory Visit: Payer: Self-pay

## 2019-09-06 ENCOUNTER — Ambulatory Visit (INDEPENDENT_AMBULATORY_CARE_PROVIDER_SITE_OTHER): Payer: Commercial Managed Care - PPO | Admitting: Obstetrics & Gynecology

## 2019-09-06 ENCOUNTER — Encounter: Payer: Self-pay | Admitting: Obstetrics & Gynecology

## 2019-09-06 VITALS — BP 120/80 | Ht 64.0 in | Wt 156.0 lb

## 2019-09-06 DIAGNOSIS — F419 Anxiety disorder, unspecified: Secondary | ICD-10-CM | POA: Diagnosis not present

## 2019-09-06 DIAGNOSIS — Z3041 Encounter for surveillance of contraceptive pills: Secondary | ICD-10-CM

## 2019-09-06 DIAGNOSIS — Z124 Encounter for screening for malignant neoplasm of cervix: Secondary | ICD-10-CM | POA: Insufficient documentation

## 2019-09-06 DIAGNOSIS — Z1329 Encounter for screening for other suspected endocrine disorder: Secondary | ICD-10-CM

## 2019-09-06 DIAGNOSIS — Z01419 Encounter for gynecological examination (general) (routine) without abnormal findings: Secondary | ICD-10-CM | POA: Diagnosis not present

## 2019-09-06 DIAGNOSIS — Z1321 Encounter for screening for nutritional disorder: Secondary | ICD-10-CM

## 2019-09-06 DIAGNOSIS — Z13220 Encounter for screening for lipoid disorders: Secondary | ICD-10-CM

## 2019-09-06 DIAGNOSIS — Z131 Encounter for screening for diabetes mellitus: Secondary | ICD-10-CM

## 2019-09-06 MED ORDER — LO LOESTRIN FE 1 MG-10 MCG / 10 MCG PO TABS: 1.0000 | ORAL_TABLET | Freq: Every day | ORAL | 3 refills | Status: DC

## 2019-09-06 MED ORDER — PAROXETINE HCL 10 MG PO TABS: 10.0000 mg | ORAL_TABLET | Freq: Every day | ORAL | 3 refills | Status: DC

## 2019-09-06 NOTE — Patient Instructions (Signed)
PAP every three years Labs soon  Thank you for choosing [facility]. As part of our ongoing efforts to improve patient experience, we would appreciate your feedback. Please fill out the short survey that you will receive by mail or MyChart. Your opinion is important to [place]! - Dr. Tiburcio Pea

## 2019-09-06 NOTE — Progress Notes (Signed)
HPI:      [name] is a 24 y.o. G0P0000 who LMP was Patient's last menstrual period was 08/19/2019., she presents today for her annual examination. The patient has no complaints today. The patient is sexually active. Her last pap: was normal. The patient does perform self breast exams.  There is no notable family history of breast or ovarian cancer in her family.  The patient has regular exercise: yes.  The patient denies current symptoms of depression.    GYN History: Contraception: NuvaRing vaginal inserts  PMHx: History reviewed. No pertinent past medical history. Past Surgical History:  Procedure Laterality Date  . WISDOM TOOTH EXTRACTION     History reviewed. No pertinent family history. Social History   Tobacco Use  . Smoking status: Never Smoker  . Smokeless tobacco: Never Used  Substance Use Topics  . Alcohol use: Yes    Comment: Occa  . Drug use: Never    Current Outpatient Medications:  .  Norethindrone-Ethinyl Estradiol-Fe Biphas (LO LOESTRIN FE) 1 MG-10 MCG / 10 MCG tablet, Take 1 tablet by mouth daily., Disp: 84 tablet, Rfl: 3 .  PARoxetine (PAXIL) 10 MG tablet, Take 1 tablet (10 mg total) by mouth daily., Disp: 90 tablet, Rfl: 3 Allergies: Patient has no known allergies.  Review of Systems  Constitutional: Negative for chills, fever and malaise/fatigue.  HENT: Negative for congestion, sinus pain and sore throat.   Eyes: Negative for blurred vision and pain.  Respiratory: Negative for cough and wheezing.   Cardiovascular: Negative for chest pain and leg swelling.  Gastrointestinal: Negative for abdominal pain, constipation, diarrhea, heartburn, nausea and vomiting.  Genitourinary: Negative for dysuria, frequency, hematuria and urgency.  Musculoskeletal: Negative for back pain, joint pain, myalgias and neck pain.  Skin: Negative for itching and rash.  Neurological: Negative for dizziness, tremors and weakness.  Endo/Heme/Allergies: Does not  bruise/bleed easily.  Psychiatric/Behavioral: Negative for depression. The patient is not nervous/anxious and does not have insomnia.     Objective: BP 120/80   Ht 5\' 4"  (1.626 m)   Wt 156 lb (70.8 kg)   LMP 08/19/2019   BMI 26.78 kg/m   Filed Weights   09/06/19 0900  Weight: 156 lb (70.8 kg)   Body mass index is 26.78 kg/m. Physical Exam Constitutional:      General: She is not in acute distress.    Appearance: She is well-developed.  Genitourinary:     Pelvic exam was performed with patient supine.     Vagina, uterus and rectum normal.     No lesions in the vagina.     No vaginal bleeding.     No cervical motion tenderness, friability, lesion or polyp.     Uterus is mobile.     Uterus is not enlarged.     No uterine mass detected.    Uterus is midaxial.     No right or left adnexal mass present.     Right adnexa not tender.     Left adnexa not tender.  HENT:     Head: Normocephalic and atraumatic. No laceration.     Right Ear: Hearing normal.     Left Ear: Hearing normal.     Mouth/Throat:     Pharynx: Uvula midline.  Eyes:     Pupils: Pupils are equal, round, and reactive to light.  Neck:     Thyroid: No thyromegaly.  Cardiovascular:     Rate and Rhythm: Normal rate and regular rhythm.  Heart sounds: No murmur heard.  No friction rub. No gallop.   Pulmonary:     Effort: Pulmonary effort is normal. No respiratory distress.     Breath sounds: Normal breath sounds. No wheezing.  Chest:     Breasts:        Right: No mass, skin change or tenderness.        Left: No mass, skin change or tenderness.  Abdominal:     General: Bowel sounds are normal. There is no distension.     Palpations: Abdomen is soft.     Tenderness: There is no abdominal tenderness. There is no rebound.  Musculoskeletal:        General: Normal range of motion.     Cervical back: Normal range of motion and neck supple.  Neurological:     Mental Status: She is alert and oriented to  person, place, and time.     Cranial Nerves: No cranial nerve deficit.  Skin:    General: Skin is warm and dry.  Psychiatric:        Judgment: Judgment normal.  Vitals reviewed.     Assessment:  ANNUAL EXAM 1. Women's annual routine gynecological examination   2. Screening for cervical cancer   3. Encounter for surveillance of contraceptive pills   4. Anxiety   5. Screening for diabetes mellitus   6. Screening for thyroid disorder   7. Screening for cholesterol level   8. Encounter for vitamin deficiency screening      Screening Plan:            1.  Cervical Screening-  Pap smear done today  2.  Labs To return fasting at a later date  3.  Counseling for contraception: oral progesterone-only contraceptive - Pt desires change due to discomforts at times with the Nuvaring during sex - Norethindrone-Ethinyl Estradiol-Fe Biphas (LO LOESTRIN FE) 1 MG-10 MCG / 10 MCG tablet; Take 1 tablet by mouth daily.  Dispense: 84 tablet; Refill: 3   4. Anxiety, cont current meds - PARoxetine (PAXIL) 10 MG tablet; Take 1 tablet (10 mg total) by mouth daily.  Dispense: 90 tablet; Refill: 3    F/U  Return in about 1 year (around 09/05/2020) for Annual, also Sch LAB appt soon (fasting).  Dana Major, MD, [facility], [facility] 09/06/2019  9:26 AM

## 2019-09-07 ENCOUNTER — Other Ambulatory Visit: Payer: Commercial Managed Care - PPO

## 2019-09-07 ENCOUNTER — Other Ambulatory Visit: Payer: Self-pay | Admitting: Obstetrics & Gynecology

## 2019-09-07 DIAGNOSIS — Z131 Encounter for screening for diabetes mellitus: Secondary | ICD-10-CM

## 2019-09-07 DIAGNOSIS — Z13220 Encounter for screening for lipoid disorders: Secondary | ICD-10-CM

## 2019-09-07 DIAGNOSIS — Z1321 Encounter for screening for nutritional disorder: Secondary | ICD-10-CM

## 2019-09-07 DIAGNOSIS — Z1329 Encounter for screening for other suspected endocrine disorder: Secondary | ICD-10-CM

## 2019-09-07 LAB — CYTOLOGY - PAP
Chlamydia: NEGATIVE
Comment: NEGATIVE
Comment: NEGATIVE
Comment: NORMAL
Diagnosis: NEGATIVE
Neisseria Gonorrhea: NEGATIVE
Trichomonas: NEGATIVE

## 2019-09-08 ENCOUNTER — Ambulatory Visit: Payer: 59 | Admitting: Obstetrics & Gynecology

## 2019-09-08 LAB — LIPID PANEL
Chol/HDL Ratio: 2.3 ratio (ref 0.0–4.4)
Cholesterol, Total: 155 mg/dL (ref 100–199)
HDL: 66 mg/dL (ref >39)
LDL Chol Calc (NIH): 75 mg/dL (ref 0–99)
Triglycerides: 72 mg/dL (ref 0–149)
VLDL Cholesterol Cal: 14 mg/dL (ref 5–40)

## 2019-09-08 LAB — VITAMIN D 25 HYDROXY (VIT D DEFICIENCY, FRACTURES): Vit D, 25-Hydroxy: 51.4 ng/mL (ref 30.0–100.0)

## 2019-09-08 LAB — TSH: TSH: 1.38 u[IU]/mL (ref 0.450–4.500)

## 2019-09-08 LAB — GLUCOSE, FASTING: Glucose, Plasma: 80 mg/dL (ref 65–99)

## 2019-09-14 ENCOUNTER — Other Ambulatory Visit: Payer: Self-pay | Admitting: Obstetrics & Gynecology

## 2019-09-14 MED ORDER — NORGESTIMATE-ETH ESTRADIOL 0.25-35 MG-MCG PO TABS: 1.0000 | ORAL_TABLET | Freq: Every day | ORAL | 3 refills | Status: DC

## 2019-09-14 NOTE — Telephone Encounter (Signed)
Attempt to put insurance in for patient and we are receiving connectivity errors in response. Will trying to resubmit. Called and left voicemail for patient to be aware.

## 2019-09-15 NOTE — Telephone Encounter (Signed)
Insurance updated on file

## 2019-11-02 ENCOUNTER — Telehealth: Payer: Self-pay

## 2019-11-02 NOTE — Telephone Encounter (Signed)
Pt calling; new bc since July; ins is not covering it; please change to something cheaper.  [phone]  Left detailed msg for pt to call ins and get list of what they will cover and let us know.

## 2019-11-14 ENCOUNTER — Telehealth: Payer: Self-pay

## 2019-11-14 ENCOUNTER — Other Ambulatory Visit: Payer: Self-pay

## 2019-11-14 ENCOUNTER — Other Ambulatory Visit: Payer: Self-pay | Admitting: Obstetrics & Gynecology

## 2019-11-14 MED ORDER — NORETHIN ACE-ETH ESTRAD-FE 1-20 MG-MCG PO TABS: 1.0000 | ORAL_TABLET | Freq: Every day | ORAL | 3 refills | Status: DC

## 2019-11-14 NOTE — Telephone Encounter (Signed)
lmtrc

## 2019-11-14 NOTE — Telephone Encounter (Signed)
Pt calling; would like her Loestrin FE changed to Junel FE as it is covered by her insurance.  [phone]

## 2019-11-14 NOTE — Telephone Encounter (Signed)
Let her know this has been changed

## 2020-04-29 ENCOUNTER — Other Ambulatory Visit: Payer: Self-pay | Admitting: Obstetrics & Gynecology

## 2020-04-29 MED ORDER — SULFAMETHOXAZOLE-TRIMETHOPRIM 800-160 MG PO TABS: 1.0000 | ORAL_TABLET | Freq: Two times a day (BID) | ORAL | 0 refills | Status: AC

## 2020-05-07 ENCOUNTER — Other Ambulatory Visit: Payer: Self-pay | Admitting: Obstetrics & Gynecology

## 2020-05-07 ENCOUNTER — Other Ambulatory Visit: Payer: Self-pay

## 2020-05-07 ENCOUNTER — Encounter: Payer: Self-pay | Admitting: Obstetrics & Gynecology

## 2020-05-07 ENCOUNTER — Other Ambulatory Visit (HOSPITAL_COMMUNITY)
Admission: RE | Admit: 2020-05-07 | Discharge: 2020-05-07 | Disposition: A | Discharge status: Home / Self Care | Payer: Commercial Managed Care - PPO | Source: Ambulatory Visit | Attending: Obstetrics & Gynecology | Admitting: Obstetrics & Gynecology

## 2020-05-07 ENCOUNTER — Ambulatory Visit (INDEPENDENT_AMBULATORY_CARE_PROVIDER_SITE_OTHER): Payer: Commercial Managed Care - PPO | Admitting: Obstetrics & Gynecology

## 2020-05-07 VITALS — BP 120/80 | Ht 63.0 in | Wt 157.0 lb

## 2020-05-07 DIAGNOSIS — N3000 Acute cystitis without hematuria: Secondary | ICD-10-CM

## 2020-05-07 DIAGNOSIS — N898 Other specified noninflammatory disorders of vagina: Secondary | ICD-10-CM | POA: Diagnosis present

## 2020-05-07 LAB — POCT URINALYSIS DIPSTICK
Appearance: NORMAL
Bilirubin, UA: NEGATIVE
Glucose, UA: NEGATIVE
Ketones, UA: NEGATIVE
Leukocytes, UA: NEGATIVE
Nitrite, UA: NEGATIVE
Odor: NORMAL
Protein, UA: NEGATIVE
Spec Grav, UA: 1.01 (ref 1.010–1.025)
Urobilinogen, UA: 0.2 E.U./dL
pH, UA: 6.5 (ref 5.0–8.0)

## 2020-05-07 NOTE — Progress Notes (Signed)
HPI:      [name] is a 25 y.o. G0P0000 who LMP was Patient's last menstrual period was 04/24/2020., presents today for a problem visit.  She complains of:  Patient first complains of urinary freq 2 weeks ago; treated for UTI w ABX; then she noticed an abnormal vaginal discharge for 1 week. Vaginal symptoms include discharge described as brown to even bloody this am.Vulvar symptoms include none.STI Risk: Possible STD exposureDischarge described as: odorless.Other associated symptoms: no burning or itching; LMP 2 weeks ago and is reg on OCPs, has not missed a pill; sexually active.Menstrual pattern: She had been bleeding regularly. Contraception: OCP (estrogen/progesterone)  PMHx: She  has no past medical history on file. Also,  has a past surgical history that includes Wisdom tooth extraction., family history is not on file.,  reports that she has never smoked. She has never used smokeless tobacco. She reports current alcohol use. She reports that she does not use drugs.  She has a current medication list which includes the following prescription(s): norethindrone-ethinyl estradiol and paroxetine. Also, has No Known Allergies.  Review of Systems  Constitutional: Negative for chills, fever and malaise/fatigue.  HENT: Negative for congestion, sinus pain and sore throat.   Eyes: Negative for blurred vision and pain.  Respiratory: Negative for cough and wheezing.   Cardiovascular: Negative for chest pain and leg swelling.  Gastrointestinal: Negative for abdominal pain, constipation, diarrhea, heartburn, nausea and vomiting.  Genitourinary: Negative for dysuria, frequency, hematuria and urgency.  Musculoskeletal: Negative for back pain, joint pain, myalgias and neck pain.  Skin: Negative for itching and rash.  Neurological: Negative for dizziness, tremors and weakness.  Endo/Heme/Allergies: Does not bruise/bleed easily.  Psychiatric/Behavioral: Negative for depression. The patient is  not nervous/anxious and does not have insomnia.     Objective: BP 120/80   Ht 5\' 3"  (1.6 m)   Wt 157 lb (71.2 kg)   LMP 04/24/2020   BMI 27.81 kg/m  Physical Exam Constitutional:      General: She is not in acute distress.    Appearance: She is well-developed.  Genitourinary:     No vaginal erythema or bleeding.      Right Adnexa: not tender and no mass present.    Left Adnexa: not tender and no mass present.    No cervical motion tenderness, discharge, polyp or nabothian cyst.     Uterus is not enlarged.     No uterine mass detected. HENT:     Head: Normocephalic and atraumatic.     Nose: Nose normal.  Abdominal:     General: There is no distension.     Palpations: Abdomen is soft.     Tenderness: There is no abdominal tenderness.  Musculoskeletal:        General: Normal range of motion.  Neurological:     Mental Status: She is alert and oriented to person, place, and time.     Cranial Nerves: No cranial nerve deficit.  Skin:    General: Skin is warm and dry.  Psychiatric:        Attention and Perception: Attention normal.        Mood and Affect: Mood and affect normal.        Speech: Speech normal.        Behavior: Behavior normal.        Thought Content: Thought content normal.        Judgment: Judgment normal.   UA- POS HEME  ASSESSMENT/PLAN:  Problem List Items Addressed This Visit    Visit Diagnoses    Acute cystitis without hematuria       Relevant Orders   Urine Culture   Vaginal discharge       Relevant Orders   Cervicovaginal ancillary only    Plan to test and treat based on results Monitor cycles as well  Annamarie Major, MD, [facility], [facility] 05/07/2020  5:05 PM

## 2020-05-09 LAB — CERVICOVAGINAL ANCILLARY ONLY
Bacterial Vaginitis (gardnerella): NEGATIVE
Candida Glabrata: NEGATIVE
Candida Vaginitis: NEGATIVE
Chlamydia: NEGATIVE
Comment: NEGATIVE
Comment: NEGATIVE
Comment: NEGATIVE
Comment: NEGATIVE
Comment: NEGATIVE
Comment: NORMAL
Neisseria Gonorrhea: NEGATIVE
Trichomonas: NEGATIVE

## 2020-05-10 LAB — URINE CULTURE: Organism ID, Bacteria: NO GROWTH

## 2020-08-21 ENCOUNTER — Other Ambulatory Visit: Payer: Self-pay | Admitting: Obstetrics & Gynecology

## 2020-08-21 DIAGNOSIS — F419 Anxiety disorder, unspecified: Secondary | ICD-10-CM

## 2020-08-23 ENCOUNTER — Other Ambulatory Visit: Payer: Self-pay | Admitting: Obstetrics & Gynecology

## 2020-08-23 DIAGNOSIS — F419 Anxiety disorder, unspecified: Secondary | ICD-10-CM

## 2020-09-12 ENCOUNTER — Ambulatory Visit: Payer: Commercial Managed Care - PPO | Admitting: Obstetrics & Gynecology

## 2020-09-29 ENCOUNTER — Other Ambulatory Visit: Payer: Self-pay | Admitting: Obstetrics & Gynecology

## 2020-09-30 ENCOUNTER — Other Ambulatory Visit: Payer: Self-pay | Admitting: Obstetrics & Gynecology

## 2020-09-30 MED ORDER — NORETHIN ACE-ETH ESTRAD-FE 1-20 MG-MCG PO TABS: 1.0000 | ORAL_TABLET | Freq: Every day | ORAL | 0 refills | Status: DC

## 2020-11-19 ENCOUNTER — Ambulatory Visit (INDEPENDENT_AMBULATORY_CARE_PROVIDER_SITE_OTHER): Payer: Commercial Managed Care - PPO | Admitting: Obstetrics & Gynecology

## 2020-11-19 ENCOUNTER — Other Ambulatory Visit (HOSPITAL_COMMUNITY)
Admission: RE | Admit: 2020-11-19 | Discharge: 2020-11-19 | Disposition: A | Discharge status: Home / Self Care | Payer: Commercial Managed Care - PPO | Source: Ambulatory Visit | Attending: Obstetrics & Gynecology | Admitting: Obstetrics & Gynecology

## 2020-11-19 ENCOUNTER — Encounter: Payer: Self-pay | Admitting: Obstetrics & Gynecology

## 2020-11-19 ENCOUNTER — Other Ambulatory Visit: Payer: Self-pay

## 2020-11-19 VITALS — BP 120/80 | Ht 63.0 in | Wt 154.0 lb

## 2020-11-19 DIAGNOSIS — Z124 Encounter for screening for malignant neoplasm of cervix: Secondary | ICD-10-CM

## 2020-11-19 DIAGNOSIS — Z113 Encounter for screening for infections with a predominantly sexual mode of transmission: Secondary | ICD-10-CM | POA: Diagnosis not present

## 2020-11-19 DIAGNOSIS — F419 Anxiety disorder, unspecified: Secondary | ICD-10-CM

## 2020-11-19 DIAGNOSIS — Z01419 Encounter for gynecological examination (general) (routine) without abnormal findings: Secondary | ICD-10-CM

## 2020-11-19 MED ORDER — NORETHIN ACE-ETH ESTRAD-FE 1-20 MG-MCG PO TABS: 1.0000 | ORAL_TABLET | Freq: Every day | ORAL | 3 refills | Status: DC

## 2020-11-19 MED ORDER — PAROXETINE HCL 10 MG PO TABS: 10.0000 mg | ORAL_TABLET | Freq: Every day | ORAL | 3 refills | Status: AC

## 2020-11-19 NOTE — Progress Notes (Signed)
HPI:      Ms. Ila Landowski is a 25 y.o. G0P0000 who LMP was Patient's last menstrual period was 11/17/2020., she presents today for her annual examination. The patient has no complaints today. The patient is sexually active. Her last pap: was normal. The patient does perform self breast exams.  There is no notable family history of breast or ovarian cancer in her family.  The patient has regular exercise: yes.  The patient denies current symptoms of depression.    GYN History: Contraception: OCP (estrogen/progesterone)  PMHx: History reviewed. No pertinent past medical history. Past Surgical History:  Procedure Laterality Date   WISDOM TOOTH EXTRACTION     History reviewed. No pertinent family history. Social History   Tobacco Use   Smoking status: Never   Smokeless tobacco: Never  Substance Use Topics   Alcohol use: Yes    Comment: Occa   Drug use: Never    Current Outpatient Medications:    norethindrone-ethinyl estradiol-FE (JUNEL FE 1/20) 1-20 MG-MCG tablet, Take 1 tablet by mouth daily., Disp: 84 tablet, Rfl: 3   PARoxetine (PAXIL) 10 MG tablet, Take 1 tablet (10 mg total) by mouth daily., Disp: 90 tablet, Rfl: 3 Allergies: Patient has no known allergies.  Review of Systems  Constitutional:  Negative for chills, fever and malaise/fatigue.  HENT:  Negative for congestion, sinus pain and sore throat.   Eyes:  Negative for blurred vision and pain.  Respiratory:  Negative for cough and wheezing.   Cardiovascular:  Negative for chest pain and leg swelling.  Gastrointestinal:  Negative for abdominal pain, constipation, diarrhea, heartburn, nausea and vomiting.  Genitourinary:  Negative for dysuria, frequency, hematuria and urgency.  Musculoskeletal:  Negative for back pain, joint pain, myalgias and neck pain.  Skin:  Negative for itching and rash.  Neurological:  Negative for dizziness, tremors and weakness.  Endo/Heme/Allergies:  Does not bruise/bleed easily.   Psychiatric/Behavioral:  Negative for depression. The patient is not nervous/anxious and does not have insomnia.    Objective: BP 120/80   Ht 5\' 3"  (1.6 m)   Wt 154 lb (69.9 kg)   LMP 11/17/2020   BMI 27.28 kg/m   Filed Weights   11/19/20 1531  Weight: 154 lb (69.9 kg)   Body mass index is 27.28 kg/m. Physical Exam Constitutional:      General: She is not in acute distress.    Appearance: She is well-developed.  Genitourinary:     Bladder, rectum and urethral meatus normal.     No lesions in the vagina.     Right Labia: No rash, tenderness or lesions.    Left Labia: No tenderness, lesions or rash.    No vaginal bleeding.      Right Adnexa: not tender and no mass present.    Left Adnexa: not tender and no mass present.    No cervical motion tenderness, friability, lesion or polyp.     Uterus is not enlarged.     No uterine mass detected.    Pelvic exam was performed with patient in the lithotomy position.  Breasts:    Right: No mass, skin change or tenderness.     Left: No mass, skin change or tenderness.  HENT:     Head: Normocephalic and atraumatic. No laceration.     Right Ear: Hearing normal.     Left Ear: Hearing normal.     Mouth/Throat:     Pharynx: Uvula midline.  Eyes:     Pupils: Pupils  are equal, round, and reactive to light.  Neck:     Thyroid: No thyromegaly.  Cardiovascular:     Rate and Rhythm: Normal rate and regular rhythm.     Heart sounds: No murmur heard.   No friction rub. No gallop.  Pulmonary:     Effort: Pulmonary effort is normal. No respiratory distress.     Breath sounds: Normal breath sounds. No wheezing.  Abdominal:     General: Bowel sounds are normal. There is no distension.     Palpations: Abdomen is soft.     Tenderness: There is no abdominal tenderness. There is no rebound.  Musculoskeletal:        General: Normal range of motion.     Cervical back: Normal range of motion and neck supple.  Neurological:     Mental Status:  She is alert and oriented to person, place, and time.     Cranial Nerves: No cranial nerve deficit.  Skin:    General: Skin is warm and dry.  Psychiatric:        Judgment: Judgment normal.  Vitals reviewed.    Assessment:  ANNUAL EXAM 1. Women's annual routine gynecological examination   2. Screening for cervical cancer   3. Anxiety      Screening Plan:            1.  Cervical Screening-  Pap smear done today  2. Breast screening- FCC discussed  3. Labs  UTD  4. Counseling for contraception: oral contraceptives (estrogen/progesterone)  The pregnancy intention screening data noted above was reviewed. Potential methods of contraception were discussed. The patient elected to proceed with Oral Contraceptive.   5. Anxiety - cont meds as they help with no SE - Discussed taper off someday and certainly prior to pregnancy (not planned anytime soon) - PARoxetine (PAXIL) 10 MG tablet; Take 1 tablet (10 mg total) by mouth daily.  Dispense: 90 tablet; Refill: 3    F/U  Return in about 1 year (around 11/19/2021) for Annual.  Annamarie Major, MD, [facility], [facility] 11/19/2020  3:54 PM

## 2020-11-19 NOTE — Patient Instructions (Signed)
Recommendations to boost your immunity to prevent illness such as viral flu and colds, including covid19, are as follows:       - - -  Vitamin K2 and Vitamin D3  - - - Take Vitamin K2 at 200-300 mcg daily (usually 2-3 pills daily of the over the counter formulation). Take Vitamin D3 at 3000-4000 U daily (usually 3-4 pills daily of the over the counter formulation). Studies show that these two at high normal levels in your system are very effective in keeping your immunity so strong and protective that you will be unlikely to contract viral illness such as those listed above.  Dr Delio Slates  Thank you for choosing [facility]. As part of our ongoing efforts to improve patient experience, we would appreciate your feedback. Please fill out the short survey that you will receive by mail or MyChart. Your opinion is important to us! -Dr Jag Lenz   

## 2020-11-22 LAB — CYTOLOGY - PAP
Chlamydia: NEGATIVE
Comment: NEGATIVE
Comment: NEGATIVE
Comment: NORMAL
Diagnosis: NEGATIVE
Neisseria Gonorrhea: NEGATIVE
Trichomonas: NEGATIVE

## 2020-12-17 ENCOUNTER — Other Ambulatory Visit: Payer: Self-pay | Admitting: Obstetrics & Gynecology

## 2021-05-25 IMAGING — DX DG ANKLE COMPLETE 3+V*L*
3 series · 3 of 3 positions shown · non-contrast
Comparison: None.

CLINICAL DATA: Pt states she was doing a vertical jump at the gym
today and landed in an odd position on her left ankle. Pt unable to
move ankle at all due to pain. Best obtainable images at this time
due to pain.

EXAM:
LEFT ANKLE COMPLETE - 3+ VIEW

[ankle ap]
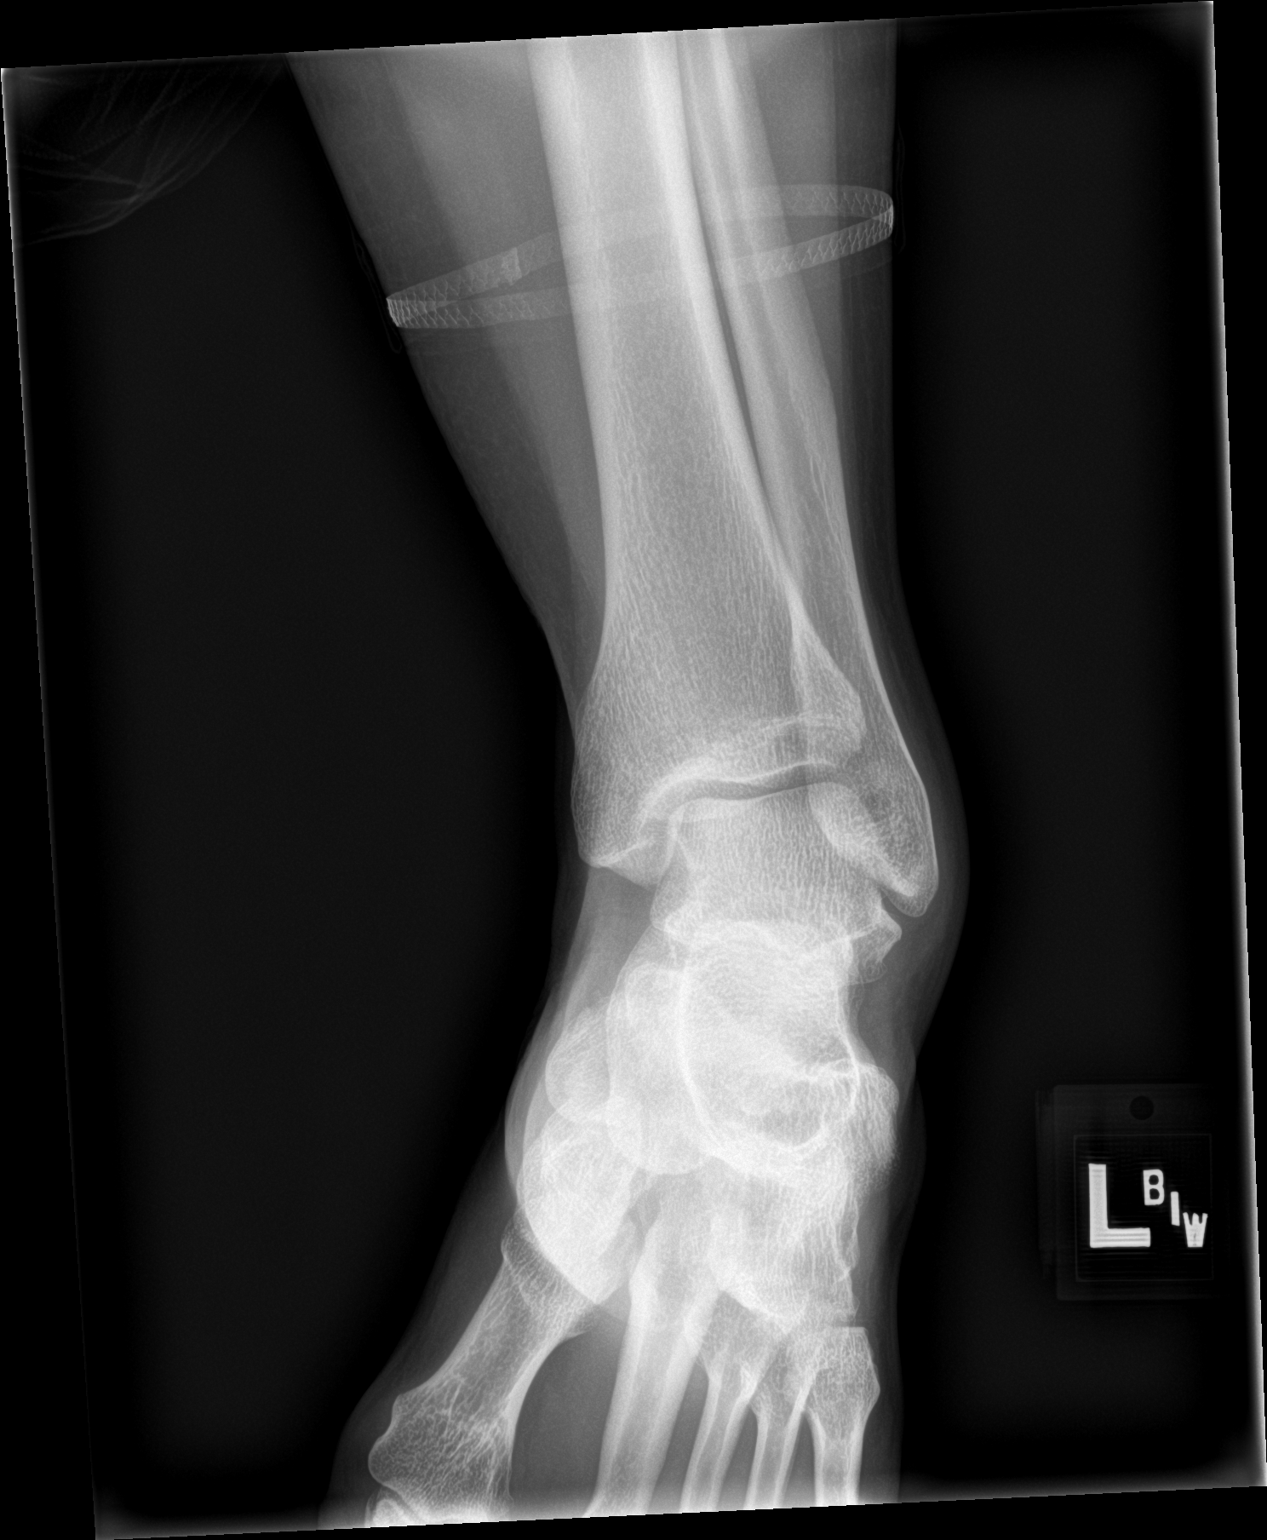

[ankle obl]
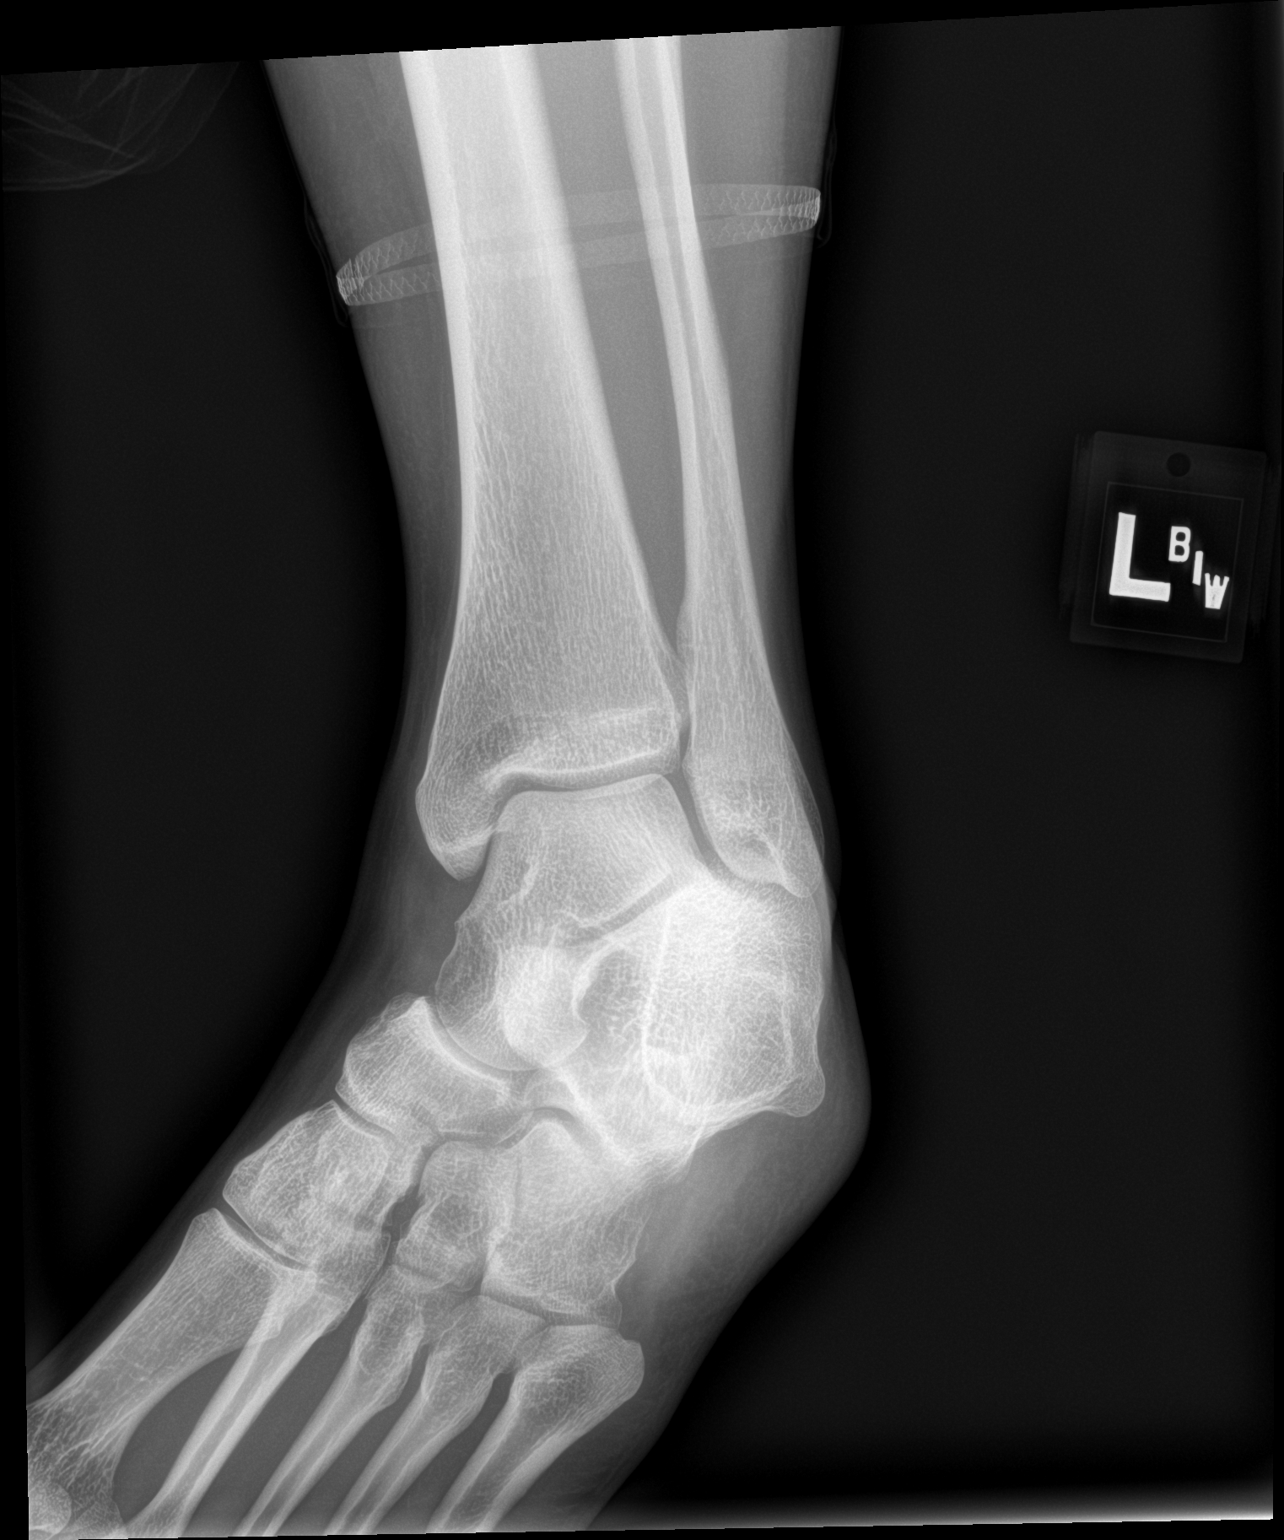

[ankle lat]
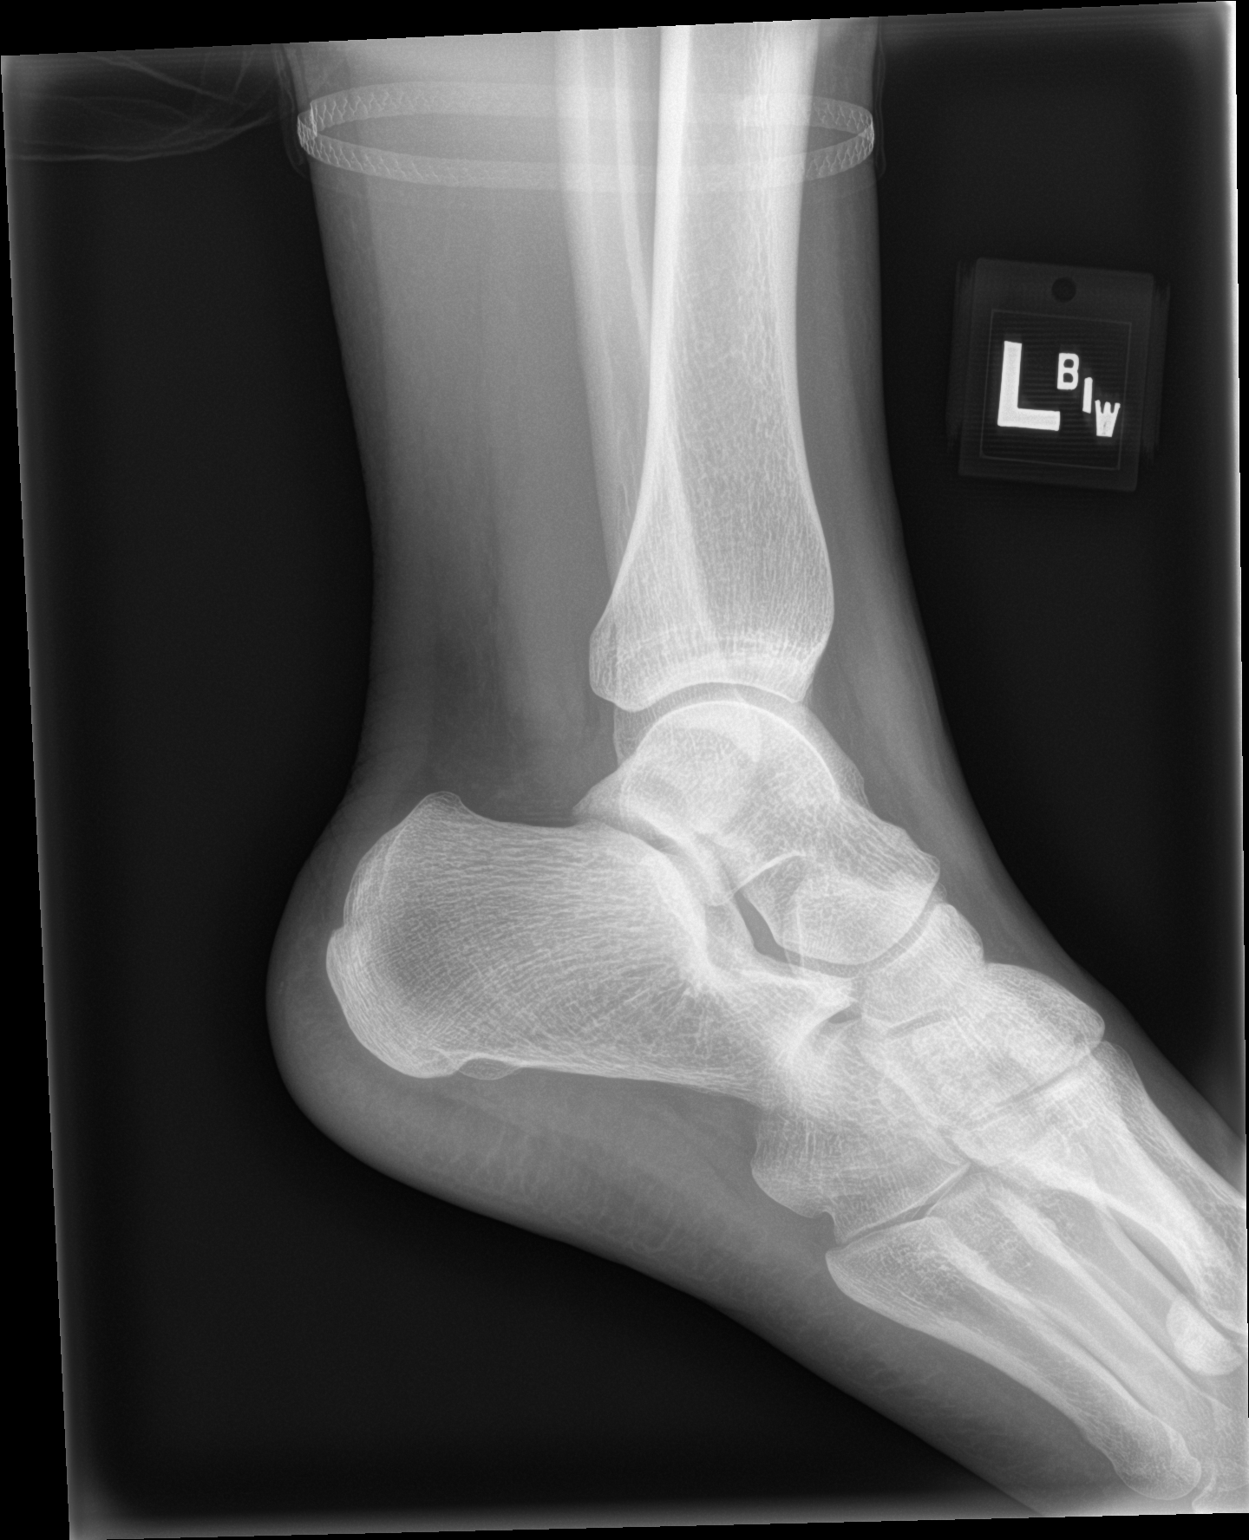

[3 of 3 positions shown; findings below may reference images not displayed]

FINDINGS: There is no evidence of fracture, dislocation, or joint effusion.
There is no evidence of arthropathy or other focal bone abnormality.
Soft tissues are unremarkable.
IMPRESSION: Negative radiographs of the left ankle.

## 2021-08-14 ENCOUNTER — Other Ambulatory Visit: Payer: Self-pay

## 2021-08-14 NOTE — Telephone Encounter (Signed)
Opened in error. KW °

## 2021-08-28 ENCOUNTER — Other Ambulatory Visit: Payer: Self-pay

## 2021-08-28 DIAGNOSIS — F419 Anxiety disorder, unspecified: Secondary | ICD-10-CM

## 2021-08-28 NOTE — Telephone Encounter (Signed)
[facility] sent in request for Paxil 10mg . According to chart patient has established care at a new practice as of 08/12/21 and saw 10/13/21 from [facility] [place]. Patient was last seen at this practice 11/19/2020. Will deny prescription request.

## 2022-10-05 ENCOUNTER — Encounter: Payer: Self-pay | Admitting: Emergency Medicine

## 2022-10-05 ENCOUNTER — Other Ambulatory Visit: Payer: Self-pay

## 2022-10-05 ENCOUNTER — Ambulatory Visit
Admission: EM | Admit: 2022-10-05 | Discharge: 2022-10-05 | Disposition: A | Discharge status: Home / Self Care | Payer: Commercial Managed Care - PPO | Attending: Internal Medicine | Admitting: Internal Medicine

## 2022-10-05 DIAGNOSIS — B349 Viral infection, unspecified: Secondary | ICD-10-CM

## 2022-10-05 HISTORY — DX: Anxiety disorder, unspecified: F41.9

## 2022-10-05 LAB — POCT RAPID STREP A (OFFICE): Rapid Strep A Screen: NEGATIVE

## 2022-10-05 NOTE — Discharge Instructions (Addendum)
The strep test is negative.      Take Tylenol as needed for fever or discomfort.  Rest and keep yourself hydrated.    Follow-up with your primary care provider if your symptoms are not improving.     

## 2022-10-05 NOTE — ED Triage Notes (Signed)
Pt here for sore throat for 4-5 days.  Pain with talking and swallowing.  Reports fevers at home.  Temp was 100 this AM and took tylenol earlier.

## 2022-10-05 NOTE — ED Provider Notes (Signed)
[facility]    CSN: [id number] Arrival date & time: 10/05/22  0846      History   Chief Complaint Chief Complaint  Patient presents with   Sore Throat         HPI Dana Ruiz is a 27 y.o. female.  Patient presents with 4-day history of fever, sore throat, cough.  Tmax 102 yesterday.  Treating with Tylenol.  She denies rash, shortness of breath, vomiting, diarrhea, or other symptoms.  Patient is a Education officer, museum.  No pertinent medical history.  The history is provided by the patient and medical records.    Past Medical History:  Diagnosis Date   Anxiety     Patient Active Problem List   Diagnosis Date Noted   STD exposure 08/27/2016    Past Surgical History:  Procedure Laterality Date   WISDOM TOOTH EXTRACTION      OB History     Gravida  0   Para  0   Term  0   Preterm  0   AB  0   Living  0      SAB  0   IAB  0   Ectopic  0   Multiple  0   Live Births  0            Home Medications    Prior to Admission medications   Medication Sig Start Date End Date Taking? Authorizing Provider  JUNEL FE 1/20 1-20 MG-MCG tablet TAKE 1 TABLET BY MOUTH  DAILY 12/17/20   Nadara Mustard, MD  PARoxetine (PAXIL) 10 MG tablet Take 1 tablet (10 mg total) by mouth daily. 11/19/20   Nadara Mustard, MD    Family History History reviewed. No pertinent family history.  Social History Social History   Tobacco Use   Smoking status: Never   Smokeless tobacco: Never  Substance Use Topics   Alcohol use: Yes    Comment: Occa   Drug use: Never     Allergies   Patient has no known allergies.   Review of Systems Review of Systems  Constitutional:  Positive for fever. Negative for chills.  HENT:  Positive for sore throat. Negative for ear pain.   Respiratory:  Positive for cough. Negative for shortness of breath.   Cardiovascular:  Negative for chest pain and palpitations.  Gastrointestinal:  Negative for diarrhea and  vomiting.     Physical Exam Triage Vital Signs ED Triage Vitals  Encounter Vitals Group     BP 10/05/22 0901 116/76     Systolic BP Percentile --      Diastolic BP Percentile --      Pulse Rate 10/05/22 0859 95     Resp 10/05/22 0859 18     Temp 10/05/22 0859 99.8 F (37.7 C)     Temp src --      SpO2 10/05/22 0859 97 %     Weight 10/05/22 0901 150 lb (68 kg)     Height 10/05/22 0901 5\' 4"  (1.626 m)     Head Circumference --      Peak Flow --      Pain Score 10/05/22 0901 5     Pain Loc --      Pain Education --      Exclude from Growth Chart --    No data found.  Updated Vital Signs BP 116/76   Pulse 95   Temp 99.8 F (37.7 C)   Resp 18  Ht 5\' 4"  (1.626 m)   Wt 150 lb (68 kg)   LMP 09/21/2022 (Approximate)   SpO2 97%   BMI 25.75 kg/m   Visual Acuity Right Eye Distance:   Left Eye Distance:   Bilateral Distance:    Right Eye Near:   Left Eye Near:    Bilateral Near:     Physical Exam Vitals and nursing note reviewed.  Constitutional:      General: She is not in acute distress.    Appearance: She is well-developed.  HENT:     Right Ear: Tympanic membrane normal.     Left Ear: Tympanic membrane normal.     Nose: Nose normal.     Mouth/Throat:     Mouth: Mucous membranes are moist.     Pharynx: Oropharynx is clear.     Comments: PND. Cardiovascular:     Rate and Rhythm: Normal rate and regular rhythm.     Heart sounds: Normal heart sounds.  Pulmonary:     Effort: Pulmonary effort is normal. No respiratory distress.     Breath sounds: Normal breath sounds.  Musculoskeletal:     Cervical back: Neck supple.  Skin:    General: Skin is warm and dry.  Neurological:     Mental Status: She is alert.  Psychiatric:        Mood and Affect: Mood normal.        Behavior: Behavior normal.      UC Treatments / Results  Labs (all labs ordered are listed, but only abnormal results are displayed) Labs Reviewed  POCT RAPID STREP A (OFFICE)     EKG   Radiology No results found.  Procedures Procedures (including critical care time)  Medications Ordered in UC Medications - No data to display  Initial Impression / Assessment and Plan / UC Course  I have reviewed the triage vital signs and the nursing notes.  Pertinent labs & imaging results that were available during my care of the patient were reviewed by me and considered in my medical decision making (see chart for details).    Viral illness.  Rapid strep negative.  Patient declines COVID test here but will consider doing one at home.  Discussed symptomatic treatment including Tylenol or ibuprofen, rest, hydration.  Education provided on viral illness.  Instructed patient to follow up with her PCP if her symptoms are not improving.  She agrees to plan of care.    Final Clinical Impressions(s) / UC Diagnoses   Final diagnoses:  Viral illness     Discharge Instructions      The strep test is negative.    Take Tylenol as needed for fever or discomfort.  Rest and keep yourself hydrated.    Follow-up with your primary care provider if your symptoms are not improving.         ED Prescriptions   None    PDMP not reviewed this encounter.   Mickie Bail, NP 10/05/22 [phone]

## 2022-10-08 ENCOUNTER — Ambulatory Visit
Admission: RE | Admit: 2022-10-08 | Discharge: 2022-10-08 | Disposition: A | Discharge status: Home / Self Care | Payer: Commercial Managed Care - PPO | Source: Ambulatory Visit | Attending: Emergency Medicine | Admitting: Emergency Medicine

## 2022-10-08 VITALS — BP 113/82 | HR 72 | Temp 97.8°F | Resp 16

## 2022-10-08 DIAGNOSIS — J0110 Acute frontal sinusitis, unspecified: Secondary | ICD-10-CM | POA: Diagnosis not present

## 2022-10-08 MED ORDER — AMOXICILLIN-POT CLAVULANATE 875-125 MG PO TABS: 1.0000 | ORAL_TABLET | Freq: Two times a day (BID) | ORAL | 0 refills | Status: AC

## 2022-10-08 MED ORDER — PROMETHAZINE-DM 6.25-15 MG/5ML PO SYRP: 5.0000 mL | ORAL_SOLUTION | Freq: Every evening | ORAL | 0 refills | Status: AC | PRN

## 2022-10-08 NOTE — ED Triage Notes (Signed)
Patient presents to UC for fever since 09/02. States she has not taken her temp but has chills. Treating with dayquil/Nyquil.

## 2022-10-08 NOTE — ED Provider Notes (Signed)
Renaldo Fiddler    CSN: [id number] Arrival date & time: 10/08/22  0830      History   Chief Complaint Chief Complaint  Patient presents with   Generalized Body Aches    I was in on Monday for appointment. Still not feeling well. Think congestion may have gone into sinus infection. - Entered by patient   Fever    HPI Ndidi Ramaswamy is a 27 y.o. female.   Patient presents for evaluation of 9 days of fever, chills, body aches, nasal congestion, rhinorrhea, sore throat, coughing.  Beginning to experience sinus pain and pressure across the forehead as well as sinus headaches to the frontal aspect described as a dull and aching.  Has been experiencing bilateral ear fullness.  Fever primarily spiking at nighttime fluctuating between 101- 102, peaking at 103 causing severe diaphoresis interfering with sleep.  Was evaluated in this urgent care 4 days ago, diagnosed with viral illness, strep testing negative, has completed 2 home COVID test which have been also negative.  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  Has attempted use of Mucinex, DayQuil and NyQuil which have been minimally helpful.  Denies respiratory history but does intermittently use albuterol inhaler which she left at work.  Past Medical History:  Diagnosis Date   Anxiety     Patient Active Problem List   Diagnosis Date Noted   STD exposure 08/27/2016    Past Surgical History:  Procedure Laterality Date   WISDOM TOOTH EXTRACTION      OB History     Gravida  0   Para  0   Term  0   Preterm  0   AB  0   Living  0      SAB  0   IAB  0   Ectopic  0   Multiple  0   Live Births  0            Home Medications    Prior to Admission medications   Medication Sig Start Date End Date Taking? Authorizing Provider  amoxicillin-clavulanate (AUGMENTIN) 875-125 MG tablet Take 1 tablet by mouth every 12 (twelve) hours for 10 days. 10/08/22 10/18/22 Yes Santosha Jividen R, NP   promethazine-dextromethorphan (PROMETHAZINE-DM) 6.25-15 MG/5ML syrup Take 5 mLs by mouth at bedtime as needed for cough. 10/08/22  Yes Valinda Hoar, NP  JUNEL FE 1/20 1-20 MG-MCG tablet TAKE 1 TABLET BY MOUTH  DAILY 12/17/20   Nadara Mustard, MD  PARoxetine (PAXIL) 10 MG tablet Take 1 tablet (10 mg total) by mouth daily. 11/19/20   Nadara Mustard, MD    Family History History reviewed. No pertinent family history.  Social History Social History   Tobacco Use   Smoking status: Never   Smokeless tobacco: Never  Substance Use Topics   Alcohol use: Yes    Comment: Occa   Drug use: Never     Allergies   Patient has no known allergies.   Review of Systems Review of Systems   Physical Exam Triage Vital Signs ED Triage Vitals  Encounter Vitals Group     BP 10/08/22 0839 113/82     Systolic BP Percentile --      Diastolic BP Percentile --      Pulse Rate 10/08/22 0839 72     Resp 10/08/22 0839 16     Temp 10/08/22 0839 97.8 F (36.6 C)     Temp Source 10/08/22 0839 Temporal  SpO2 10/08/22 0839 97 %     Weight --      Height --      Head Circumference --      Peak Flow --      Pain Score 10/08/22 0841 0     Pain Loc --      Pain Education --      Exclude from Growth Chart --    No data found.  Updated Vital Signs BP 113/82 (BP Location: Left Arm)   Pulse 72   Temp 97.8 F (36.6 C) (Temporal)   Resp 16   LMP 09/21/2022 (Approximate)   SpO2 97%   Visual Acuity Right Eye Distance:   Left Eye Distance:   Bilateral Distance:    Right Eye Near:   Left Eye Near:    Bilateral Near:     Physical Exam Constitutional:      Appearance: She is ill-appearing and diaphoretic.  HENT:     Head: Normocephalic.     Right Ear: Ear canal and external ear normal. A middle ear effusion is present.     Left Ear: Ear canal and external ear normal. A middle ear effusion is present.     Nose:     Right Turbinates: Swollen.     Left Turbinates: Swollen.      Right Sinus: Frontal sinus tenderness present. No maxillary sinus tenderness.     Left Sinus: Frontal sinus tenderness present. No maxillary sinus tenderness.     Mouth/Throat:     Pharynx: No posterior oropharyngeal erythema.     Tonsils: No tonsillar exudate. 2+ on the right. 2+ on the left.  Eyes:     Extraocular Movements: Extraocular movements intact.  Cardiovascular:     Rate and Rhythm: Normal rate and regular rhythm.     Pulses: Normal pulses.     Heart sounds: Normal heart sounds.  Pulmonary:     Effort: Pulmonary effort is normal.     Breath sounds: Normal breath sounds.  Musculoskeletal:     Cervical back: Normal range of motion and neck supple.  Neurological:     Mental Status: She is alert and oriented to person, place, and time. Mental status is at baseline.      UC Treatments / Results  Labs (all labs ordered are listed, but only abnormal results are displayed) Labs Reviewed - No data to display  EKG   Radiology No results found.  Procedures Procedures (including critical care time)  Medications Ordered in UC Medications - No data to display  Initial Impression / Assessment and Plan / UC Course  I have reviewed the triage vital signs and the nursing notes.  Pertinent labs & imaging results that were available during my care of the patient were reviewed by me and considered in my medical decision making (see chart for details).  Acute nonrecurrent frontal sinusitis  Patient is in no signs of distress nor toxic appearing.  Vital signs are stable.  Low suspicion for pneumonia, pneumothorax or bronchitis and therefore will defer imaging.  Symptoms present for 9 days without any signs of resolution will provide coverage for sinusitis.  Prescribed Augmentin as well as Promethazine DM to help with sleeping.May use additional over-the-counter medications as needed for supportive care.  May follow-up with urgent care as needed if symptoms persist or worsen.  Note  given.   Final Clinical Impressions(s) / UC Diagnoses   Final diagnoses:  Acute non-recurrent frontal sinusitis     Discharge Instructions  Begin use of Augmentin every morning and every evening for 10 days for treatment of the sinus infection takes about 48 hours for medicine to fully get in system but should see improvement day by day  May use cough syrup at bedtime to help you sleep    You can take Tylenol and/or Ibuprofen as needed for fever reduction and pain relief.   For cough: honey 1/2 to 1 teaspoon (you can dilute the honey in water or another fluid).  You can also use guaifenesin and dextromethorphan for cough. You can use a humidifier for chest congestion and cough.  If you don't have a humidifier, you can sit in the bathroom with the hot shower running.      For sore throat: try warm salt water gargles, cepacol lozenges, throat spray, warm tea or water with lemon/honey, popsicles or ice, or OTC cold relief medicine for throat discomfort.   For congestion: take a daily anti-histamine like Zyrtec, Claritin, and a oral decongestant, such as pseudoephedrine.  You can also use Flonase 1-2 sprays in each nostril daily.   It is important to stay hydrated: drink plenty of fluids (water, gatorade/powerade/pedialyte, juices, or teas) to keep your throat moisturized and help further relieve irritation/discomfort.    ED Prescriptions     Medication Sig Dispense Auth. Provider   amoxicillin-clavulanate (AUGMENTIN) 875-125 MG tablet Take 1 tablet by mouth every 12 (twelve) hours for 10 days. 20 tablet Tiauna Whisnant, Hansel Starling R, NP   promethazine-dextromethorphan (PROMETHAZINE-DM) 6.25-15 MG/5ML syrup Take 5 mLs by mouth at bedtime as needed for cough. 118 mL Khushboo Chuck, Elita Boone, NP      PDMP not reviewed this encounter.   Valinda Hoar, [place] 10/08/22 [phone]

## 2022-10-08 NOTE — Discharge Instructions (Addendum)
Begin use of Augmentin every morning and every evening for 10 days for treatment of the sinus infection takes about 48 hours for medicine to fully get in system but should see improvement day by day  May use cough syrup at bedtime to help you sleep    You can take Tylenol and/or Ibuprofen as needed for fever reduction and pain relief.   For cough: honey 1/2 to 1 teaspoon (you can dilute the honey in water or another fluid).  You can also use guaifenesin and dextromethorphan for cough. You can use a humidifier for chest congestion and cough.  If you don't have a humidifier, you can sit in the bathroom with the hot shower running.      For sore throat: try warm salt water gargles, cepacol lozenges, throat spray, warm tea or water with lemon/honey, popsicles or ice, or OTC cold relief medicine for throat discomfort.   For congestion: take a daily anti-histamine like Zyrtec, Claritin, and a oral decongestant, such as pseudoephedrine.  You can also use Flonase 1-2 sprays in each nostril daily.   It is important to stay hydrated: drink plenty of fluids (water, gatorade/powerade/pedialyte, juices, or teas) to keep your throat moisturized and help further relieve irritation/discomfort.
